# Patient Record
Sex: Male | Born: 1985 | Race: White | Hispanic: No | Marital: Married | Smoking: Current some day smoker
Health system: Southern US, Community
[De-identification: ages and names within clinical notes are randomized; demographics above are authoritative.]

## PROBLEM LIST (undated history)

## (undated) DIAGNOSIS — J45909 Unspecified asthma, uncomplicated: Secondary | ICD-10-CM

## (undated) DIAGNOSIS — F419 Anxiety disorder, unspecified: Secondary | ICD-10-CM

## (undated) DIAGNOSIS — F4310 Post-traumatic stress disorder, unspecified: Secondary | ICD-10-CM

## (undated) DIAGNOSIS — I10 Essential (primary) hypertension: Secondary | ICD-10-CM

## (undated) HISTORY — PX: NASAL SEPTUM SURGERY: SHX37

## (undated) HISTORY — DX: Unspecified asthma, uncomplicated: J45.909

## (undated) HISTORY — PX: HERNIA REPAIR: SHX51

## (undated) HISTORY — PX: APPENDECTOMY: SHX54

---
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
## 2007-07-08 ENCOUNTER — Emergency Department (HOSPITAL_COMMUNITY): Admission: EM | Admit: 2007-07-08 | Discharge: 2007-07-09 | Payer: Self-pay | Admitting: Emergency Medicine

## 2011-05-06 NOTE — Op Note (Signed)
NAMECHANZ, CAHALL                          ACCOUNT NO.:  [id number]   MEDICAL RECORD NO.:  [id number]                   PATIENT TYPE:  INP   LOCATION:  0457                                 FACILITY:  [facility]   PHYSICIAN:  Ollen Gross. Vernell Morgans, M.D.              DATE OF BIRTH:  1986-10-12   DATE OF PROCEDURE:  11/04/2002  DATE OF DISCHARGE:  11/05/2002                                 OPERATIVE REPORT   PREOPERATIVE DIAGNOSIS:  Acute appendicitis.   POSTOPERATIVE DIAGNOSIS:  Acute appendicitis.   PROCEDURE:  Laparoscopic appendectomy.   SURGEON:  Ollen Gross. Carolynne Edouard, M.D.   ANESTHESIA:  General endotracheal.   DESCRIPTION OF PROCEDURE:  After informed consent was obtained, The patient  was brought to the operating room, placed in supine position on the  operating room table. The patient's left arm was tucked at his side. After  adequate induction of general anesthesia, the patient's abdomen was prepped  with Betadine, draped in the usual sterile manner. The area below the  umbilicus was infiltrated with 0.25% Marcaine and a small incision was made  with a 15 blade knife. This incision was carried down through the  subcutaneous tissue bluntly with the Kelly clamp and [employer] retractors  until the linea alba was identified. The linea alba was then incised with  the 15 blade knife and each side was grasped with Kocher clamps and elevated  anteriorly. The preperitoneal space was then probed bluntly with a hemostat  until the peritoneum was opened and access was gained to the abdominal  cavity. A #0 Vicryl pursestring stitch was then placed in the fascia  surrounding this opening, a Hasson cannula was placed through this opening  and anchored in place with the previously placed Vicryl pursestring stitch.  The abdomen was then insufflated with carbon dioxide without difficulty. A  laparoscope was placed through the Hasson cannula. The patient was placed in  slight amount of Trendelenburg  position and rotated with his left side down  a little bit. There was some inflammation that was identifiable in the right  lower quadrant at the edge of the cecum. Next, in the suprapubic area, a  small area was infiltrated with 0.25% Marcaine, an incision was made with a  10 blade knife and a 12 mm port was then placed bluntly through this  incision into the abdominal cavity under direct vision. Next, a site was  chosen just above the Hasson cannula into the right of midline for placement  of a 5 mm port. This area was infiltrated with 0.25% Marcaine and a small  stab incision was made with a 15 blade knife. A 5 mm port was then placed  bluntly through this incision into the abdominal cavity under direct vision.  Using these ports, the right lower quadrant was explored and an inflamed  appendix was able to be identified, it was note adhesed to any structures  in  the pelvis. It was able to be elevated and the mesoappendix was taken down  using the harmonic scalpel. Once this was accomplished, an EndoGIA stapler  with a standard load was placed through the 12 mm port and across the base  of the appendix which was being elevated anteriorly. The stapler was in good  position and was clamped and fired dividing the appendix. The staple line  looked very good and intact. The appendix was then grasped with a Glassman  retractor and an endoscopic bag was placed through the 12 mm port and  opened. The appendix was placed within the bag and sealed. The abdomen was  then irrigated with copious amounts of saline. A grasping device was then  placed through the Hasson cannula and the laparoscope was removed through  the 12 mm port. The grasping device was used to grasp the opening of the bag  and the bag was retrieved through the infraumbilical port with the Hasson  cannula. The rest of the ports were then removed under direct vision. The  fascial defect was closed with the previously Vicryl pursestring  stitch. The  fascial defect of the 12 mm port in the suprapubic area was also closed with  a figure-of-eight #0 Vicryl stitch. The skin incisions were closed with  interrupted 4-0 Monocryl subcuticular  stitches, Benzoin and Steri-Strips were applied. The patient tolerated the  procedure well. At the end of the case, all sponge, needle and instrument  counts were correct. The patient was awakened and taken to the recovery room  in stable condition.                                               Ollen Gross. Vernell Morgans, M.D.    PST/MEDQ  D:  11/06/2002  T:  11/06/2002  Job:  [id number]

## 2011-09-26 ENCOUNTER — Emergency Department (HOSPITAL_COMMUNITY)
Admission: EM | Admit: 2011-09-26 | Discharge: 2011-09-26 | Disposition: A | Discharge status: Home / Self Care | Attending: Emergency Medicine | Admitting: Emergency Medicine

## 2011-09-26 DIAGNOSIS — K089 Disorder of teeth and supporting structures, unspecified: Secondary | ICD-10-CM | POA: Insufficient documentation

## 2011-09-26 DIAGNOSIS — I10 Essential (primary) hypertension: Secondary | ICD-10-CM | POA: Insufficient documentation

## 2011-09-26 DIAGNOSIS — K08109 Complete loss of teeth, unspecified cause, unspecified class: Secondary | ICD-10-CM | POA: Insufficient documentation

## 2011-09-26 DIAGNOSIS — K08409 Partial loss of teeth, unspecified cause, unspecified class: Secondary | ICD-10-CM | POA: Insufficient documentation

## 2013-06-23 ENCOUNTER — Emergency Department (HOSPITAL_BASED_OUTPATIENT_CLINIC_OR_DEPARTMENT_OTHER)
Admission: EM | Admit: 2013-06-23 | Discharge: 2013-06-23 | Disposition: A | Discharge status: Home / Self Care | Payer: Self-pay | Attending: Emergency Medicine | Admitting: Emergency Medicine

## 2013-06-23 ENCOUNTER — Encounter (HOSPITAL_BASED_OUTPATIENT_CLINIC_OR_DEPARTMENT_OTHER): Payer: Self-pay | Admitting: *Deleted

## 2013-06-23 DIAGNOSIS — K4090 Unilateral inguinal hernia, without obstruction or gangrene, not specified as recurrent: Secondary | ICD-10-CM | POA: Insufficient documentation

## 2013-06-23 DIAGNOSIS — I10 Essential (primary) hypertension: Secondary | ICD-10-CM | POA: Insufficient documentation

## 2013-06-23 HISTORY — DX: Essential (primary) hypertension: I10

## 2013-06-23 MED ORDER — HYDROCODONE-ACETAMINOPHEN 5-325 MG PO TABS: 2.0000 | ORAL_TABLET | ORAL | Status: DC | PRN

## 2013-06-23 NOTE — ED Provider Notes (Signed)
   History    CSN: [id number] Arrival date & time 06/23/13  1136  First MD Initiated Contact with Patient 06/23/13 1153     Chief Complaint  Patient presents with  . Abdominal Pain    HPI Patient presents with right groin pain which occurs off and on for several years.  He has known hernia there.  He did do a lot of lifting and moving yesterday.  Pain did not keep him awake last night.  It has not been associated with any nausea vomiting.  No distention.  No fever chills. Past Medical History  Diagnosis Date  . Hypertension    History reviewed. No pertinent past surgical history. No family history on file. History  Substance Use Topics  . Smoking status: Never Smoker   . Smokeless tobacco: Not on file  . Alcohol Use: No    Review of Systems All other systems reviewed and are negative Allergies  Review of patient's allergies indicates no known allergies.  Home Medications   Current Outpatient Rx  Name  Route  Sig  Dispense  Refill  . HYDROcodone-acetaminophen (NORCO/VICODIN) 5-325 MG per tablet   Oral   Take 2 tablets by mouth every 4 (four) hours as needed for pain.   30 tablet   0    BP 141/90  Pulse 104  Temp(Src) 99 F (37.2 C) (Oral)  Resp 18  SpO2 99% Physical Exam  Nursing note and vitals reviewed. Constitutional: He is oriented to person, place, and time. He appears well-developed and well-nourished. No distress.  HENT:  Head: Normocephalic and atraumatic.  Eyes: Pupils are equal, round, and reactive to light.  Neck: Normal range of motion.  Cardiovascular: Normal rate and intact distal pulses.   Pulmonary/Chest: No respiratory distress.  Abdominal: Normal appearance. He exhibits no distension. There is tenderness (Small easily reducible inguinal hernia noted on the right inguinal canal.). There is no rebound and no guarding.  Genitourinary: Testes normal. Cremasteric reflex is present. Right testis shows no swelling. Left testis shows no swelling. No  penile tenderness.  Musculoskeletal: Normal range of motion.  Neurological: He is alert and oriented to person, place, and time. No cranial nerve deficit.  Skin: Skin is warm and dry. No rash noted.  Psychiatric: He has a normal mood and affect. His behavior is normal.    ED Course  Procedures (including critical care time) Labs Reviewed - No data to display No results found. 1. Inguinal hernia     MDM  No physical findings to suggest incarceration.  Plan is to treat for pain and followup with the surgeon.  Patient will return should he develop increasing pain or associated nausea vomiting.  Nelia Shi, MD 06/23/13 [phone]

## 2013-06-23 NOTE — ED Notes (Signed)
Right side lower abd pain. Pt states that he has a hernia, but it wont stop hurting now despite resting.

## 2018-02-04 ENCOUNTER — Emergency Department (HOSPITAL_BASED_OUTPATIENT_CLINIC_OR_DEPARTMENT_OTHER)
Admission: EM | Admit: 2018-02-04 | Discharge: 2018-02-04 | Disposition: A | Discharge status: Home / Self Care | Payer: Non-veteran care | Attending: Emergency Medicine | Admitting: Emergency Medicine

## 2018-02-04 ENCOUNTER — Encounter (HOSPITAL_BASED_OUTPATIENT_CLINIC_OR_DEPARTMENT_OTHER): Payer: Self-pay | Admitting: Emergency Medicine

## 2018-02-04 ENCOUNTER — Other Ambulatory Visit: Payer: Self-pay

## 2018-02-04 DIAGNOSIS — B029 Zoster without complications: Secondary | ICD-10-CM | POA: Insufficient documentation

## 2018-02-04 DIAGNOSIS — Z79899 Other long term (current) drug therapy: Secondary | ICD-10-CM | POA: Insufficient documentation

## 2018-02-04 DIAGNOSIS — I10 Essential (primary) hypertension: Secondary | ICD-10-CM | POA: Insufficient documentation

## 2018-02-04 DIAGNOSIS — R21 Rash and other nonspecific skin eruption: Secondary | ICD-10-CM | POA: Diagnosis present

## 2018-02-04 HISTORY — DX: Post-traumatic stress disorder, unspecified: F43.10

## 2018-02-04 MED ORDER — VALACYCLOVIR HCL 1 G PO TABS: 1000.0000 mg | ORAL_TABLET | Freq: Three times a day (TID) | ORAL | 0 refills | Status: DC

## 2018-02-04 NOTE — ED Triage Notes (Signed)
Patient states that he started to have pain to his hip region on tues. and now has a rash noted with pain

## 2018-02-04 NOTE — ED Notes (Signed)
ED Provider at bedside. 

## 2018-02-04 NOTE — ED Notes (Signed)
Pt given d/c instructions as per chart. Rx x 2. Verbalizes understanding. No questions. 

## 2018-02-04 NOTE — ED Provider Notes (Signed)
[facility] EMERGENCY DEPARTMENT Provider Note   CSN: [id number] Arrival date & time: 02/04/18  1912     History   Chief Complaint Chief Complaint  Patient presents with  . Rash    HPI Martin Miles is a 32 y.o. male.  HPI  32 year old male presents with rash to his right flank.  He states he noticed pain in the area starting 5 days ago.  On 2/14 he noticed a rash.  The pain is sharp and burning.  Seems to wax and wane.  Sometimes worsens with touching but sometimes worsens without obvious cause.  Otherwise he does not have any abdominal pain, vomiting, diarrhea.  No urinary symptoms or fevers.  He is not sure if he had chickenpox as a kid.  He denies any known immunosuppression such as diabetes, HIV, or cancer.  He has not taken anything for the pain.  He rates the pain is about a 6/10.  The pain is more extensive than just the rash but does not go across his umbilicus or mid back.  Past Medical History:  Diagnosis Date  . Hypertension   . PTSD (post-traumatic stress disorder)    trigger  - large crowds    There are no active problems to display for this patient.   History reviewed. No pertinent surgical history.     Home Medications    Prior to Admission medications   Medication Sig Start Date End Date Taking? Authorizing Provider  lamoTRIgine (LAMICTAL) 200 MG tablet Take 100 mg by mouth daily.   Yes [provider]  sertraline (ZOLOFT) 25 MG tablet Take 50 mg by mouth daily.   Yes [provider]  HYDROcodone-acetaminophen (NORCO/VICODIN) 5-325 MG per tablet Take 2 tablets by mouth every 4 (four) hours as needed for pain. 06/23/13   Nelva Nay, MD  valACYclovir (VALTREX) 1000 MG tablet Take 1 tablet (1,000 mg total) by mouth 3 (three) times daily. 02/04/18   Pricilla Loveless, MD    Family History History reviewed. No pertinent family history.  Social History Social History   Tobacco Use  . Smoking status: Never Smoker  .  Smokeless tobacco: Never Used  Substance Use Topics  . Alcohol use: No  . Drug use: No     Allergies   Patient has no known allergies.   Review of Systems Review of Systems  Constitutional: Negative for fever.  Gastrointestinal: Negative for abdominal pain, diarrhea and vomiting.  Genitourinary: Positive for flank pain.  Skin: Positive for rash.  All other systems reviewed and are negative.    Physical Exam Updated Vital Signs BP (!) 150/97 (BP Location: Left Arm)   Pulse 76   Temp 98.2 F (36.8 C) (Oral)   Resp 18   Ht 6\' 1"  (1.854 m)   Wt 112.5 kg (248 lb)   SpO2 99%   BMI 32.72 kg/m   Physical Exam  Constitutional: He is oriented to person, place, and time. He appears well-developed and well-nourished.  HENT:  Head: Normocephalic and atraumatic.  Right Ear: External ear normal.  Left Ear: External ear normal.  Nose: Nose normal.  Eyes: Right eye exhibits no discharge. Left eye exhibits no discharge.  Neck: Neck supple.  Pulmonary/Chest: Effort normal.  Abdominal: Soft. He exhibits no distension. There is no tenderness.  Neurological: He is alert and oriented to person, place, and time.  Skin: Skin is warm and dry. Rash noted. Rash is vesicular.  Along right flank in the mid axillary line there  is a cluster of vesicles with erythematous base consistent with herpes zoster.  Mild tenderness at the site.  Nursing note and vitals reviewed.    ED Treatments / Results  Labs (all labs ordered are listed, but only abnormal results are displayed) Labs Reviewed - No data to display  EKG  EKG Interpretation None       Radiology No results found.  Procedures Procedures (including critical care time)  Medications Ordered in ED Medications - No data to display   Initial Impression / Assessment and Plan / ED Course  I have reviewed the triage vital signs and the nursing notes.  Pertinent labs & imaging results that were available during my care of the  patient were reviewed by me and considered in my medical decision making (see chart for details).     Presentation is consistent with zoster.  While this is on the 32 year old, he denies any known history of any.  He is just around 72 hours since onset of rash.  We discussed that there is less efficacy the longer we go without treatment and thus we will try antivirals.  I have encouraged him to use ibuprofen and Tylenol given the discomfort he has with it.  However I do not see any other concerning findings on his history or exam.  He appears stable for discharge home to follow-up with his PCP at the [facility].  Return precautions.  Final Clinical Impressions(s) / ED Diagnoses   Final diagnoses:  Herpes zoster without complication    ED Discharge Orders        Ordered    valACYclovir (VALTREX) 1000 MG tablet  3 times daily     02/04/18 2310       Pricilla LovelessGoldston, Naziah Weckerly, MD 02/04/18 2339

## 2018-03-15 ENCOUNTER — Emergency Department (HOSPITAL_BASED_OUTPATIENT_CLINIC_OR_DEPARTMENT_OTHER): Payer: Non-veteran care

## 2018-03-15 ENCOUNTER — Emergency Department (HOSPITAL_BASED_OUTPATIENT_CLINIC_OR_DEPARTMENT_OTHER)
Admission: EM | Admit: 2018-03-15 | Discharge: 2018-03-16 | Disposition: A | Discharge status: Home / Self Care | Payer: Non-veteran care | Attending: Emergency Medicine | Admitting: Emergency Medicine

## 2018-03-15 ENCOUNTER — Other Ambulatory Visit: Payer: Self-pay

## 2018-03-15 ENCOUNTER — Encounter (HOSPITAL_BASED_OUTPATIENT_CLINIC_OR_DEPARTMENT_OTHER): Payer: Self-pay

## 2018-03-15 DIAGNOSIS — Z79899 Other long term (current) drug therapy: Secondary | ICD-10-CM | POA: Insufficient documentation

## 2018-03-15 DIAGNOSIS — I10 Essential (primary) hypertension: Secondary | ICD-10-CM | POA: Insufficient documentation

## 2018-03-15 DIAGNOSIS — R072 Precordial pain: Secondary | ICD-10-CM | POA: Diagnosis not present

## 2018-03-15 HISTORY — DX: Anxiety disorder, unspecified: F41.9

## 2018-03-15 MED ORDER — GI COCKTAIL ~~LOC~~
30.0000 mL | Freq: Once | ORAL | Status: AC
  Administered 2018-03-15: 30 mL via ORAL
  Filled 2018-03-15: qty 30

## 2018-03-15 NOTE — ED Triage Notes (Signed)
C/o CP x 1 month-worse x 1-2 weeks-NAD-steady gait

## 2018-03-16 LAB — CBC
HCT: 42.3 % (ref 39.0–52.0)
HEMOGLOBIN: 14.7 g/dL (ref 13.0–17.0)
MCH: 29.7 pg (ref 26.0–34.0)
MCHC: 34.8 g/dL (ref 30.0–36.0)
MCV: 85.5 fL (ref 78.0–100.0)
Platelets: 264 10*3/uL (ref 150–400)
RBC: 4.95 MIL/uL (ref 4.22–5.81)
RDW: 12.6 % (ref 11.5–15.5)
WBC: 7.9 10*3/uL (ref 4.0–10.5)

## 2018-03-16 LAB — BASIC METABOLIC PANEL
Anion gap: 8 (ref 5–15)
BUN: 17 mg/dL (ref 6–20)
CALCIUM: 9 mg/dL (ref 8.9–10.3)
CHLORIDE: 105 mmol/L (ref 101–111)
CO2: 23 mmol/L (ref 22–32)
CREATININE: 1.01 mg/dL (ref 0.61–1.24)
GFR calc Af Amer: >60 mL/min (ref >60)
Glucose, Bld: 97 mg/dL (ref 65–99)
Potassium: 3.6 mmol/L (ref 3.5–5.1)
SODIUM: 136 mmol/L (ref 135–145)

## 2018-03-16 LAB — TROPONIN I: Troponin I: <0.03 ng/mL (ref <0.03)

## 2018-03-16 LAB — D-DIMER, QUANTITATIVE (NOT AT ARMC): D DIMER QUANT: 0.29 ug{FEU}/mL (ref 0.00–0.50)

## 2018-03-16 MED ORDER — OMEPRAZOLE 20 MG PO CPDR: 20.0000 mg | DELAYED_RELEASE_CAPSULE | Freq: Every day | ORAL | 0 refills | Status: DC

## 2018-03-16 NOTE — ED Provider Notes (Signed)
[facility] EMERGENCY DEPARTMENT Provider Note   CSN: [id number] Arrival date & time: 03/15/18  2154     History   Chief Complaint Chief Complaint  Patient presents with  . Chest Pain    HPI Martin Miles is a 32 y.o. male.  The history is provided by the patient.  Chest Pain   This is a chronic problem. The current episode started more than 1 week ago (more than a month ago). The problem occurs constantly. The problem has been gradually worsening. The pain is associated with rest (stress). The pain is present in the substernal region. The pain is moderate. The quality of the pain is described as dull. The pain does not radiate. Pertinent negatives include no abdominal pain, no back pain, no claudication, no cough, no diaphoresis, no dizziness, no exertional chest pressure, no fever, no headaches, no hemoptysis, no irregular heartbeat, no leg pain, no lower extremity edema, no malaise/fatigue, no nausea, no near-syncope, no numbness, no orthopnea, no palpitations, no PND, no shortness of breath, no sputum production, no syncope, no vomiting and no weakness. He has tried nothing for the symptoms. The treatment provided no relief. Risk factors include male gender.  Pertinent negatives for family medical history include: no Marfan's syndrome.  Procedure history is negative for cardiac catheterization.    Past Medical History:  Diagnosis Date  . Anxiety   . Hypertension   . PTSD (post-traumatic stress disorder)    trigger  - large crowds    There are no active problems to display for this patient.   History reviewed. No pertinent surgical history.      Home Medications    Prior to Admission medications   Medication Sig Start Date End Date Taking? Authorizing Provider  HYDROcodone-acetaminophen (NORCO/VICODIN) 5-325 MG per tablet Take 2 tablets by mouth every 4 (four) hours as needed for pain. 06/23/13   Nelva Nay, MD  lamoTRIgine (LAMICTAL) 200 MG tablet Take  100 mg by mouth daily.    [provider]  sertraline (ZOLOFT) 25 MG tablet Take 50 mg by mouth daily.    [provider]  valACYclovir (VALTREX) 1000 MG tablet Take 1 tablet (1,000 mg total) by mouth 3 (three) times daily. 02/04/18   Pricilla Loveless, MD    Family History No family history on file.  Social History Social History   Tobacco Use  . Smoking status: Never Smoker  . Smokeless tobacco: Never Used  Substance Use Topics  . Alcohol use: Yes    Comment: occ  . Drug use: No     Allergies   Remeron [mirtazapine]   Review of Systems Review of Systems  Constitutional: Negative for diaphoresis, fever and malaise/fatigue.  Respiratory: Negative for cough, hemoptysis, sputum production and shortness of breath.   Cardiovascular: Positive for chest pain. Negative for palpitations, orthopnea, claudication, leg swelling, syncope, PND and near-syncope.  Gastrointestinal: Negative for abdominal pain, nausea and vomiting.  Musculoskeletal: Negative for back pain.  Neurological: Negative for dizziness, weakness, numbness and headaches.  All other systems reviewed and are negative.    Physical Exam Updated Vital Signs BP (!) 143/100 (BP Location: Left Arm)   Pulse 81   Temp 98.8 F (37.1 C) (Oral)   Resp 18   Ht 6\' 1"  (1.854 m)   Wt 115.6 kg (254 lb 13.6 oz)   SpO2 100%   BMI 33.62 kg/m   Physical Exam  Constitutional: He is oriented to person, place, and time. He appears well-developed and  well-nourished. No distress.  HENT:  Head: Normocephalic and atraumatic.  Mouth/Throat: No oropharyngeal exudate.  Eyes: Pupils are equal, round, and reactive to light. Conjunctivae are normal.  Neck: Normal range of motion. Neck supple.  Cardiovascular: Normal rate, regular rhythm, normal heart sounds and intact distal pulses.  Pulmonary/Chest: Effort normal and breath sounds normal. No stridor. He has no wheezes. He has no rales.  Abdominal: Soft. He exhibits no  mass. There is no tenderness. There is no rebound and no guarding.  Gassy throughout  Musculoskeletal: Normal range of motion.  Neurological: He is alert and oriented to person, place, and time.  Skin: Skin is warm and dry. Capillary refill takes less than 2 seconds. He is not diaphoretic.  Psychiatric: He has a normal mood and affect.     ED Treatments / Results  Labs (all labs ordered are listed, but only abnormal results are displayed)  Results for orders placed or performed during the hospital encounter of 03/15/18  Basic metabolic panel  Result Value Ref Range   Sodium 136 135 - 145 mmol/L   Potassium 3.6 3.5 - 5.1 mmol/L   Chloride 105 101 - 111 mmol/L   CO2 23 22 - 32 mmol/L   Glucose, Bld 97 65 - 99 mg/dL   BUN 17 6 - 20 mg/dL   Creatinine, Ser 4.091.01 0.61 - 1.24 mg/dL   Calcium 9.0 8.9 - 81.110.3 mg/dL   GFR calc non Af Amer >60 >60 mL/min   GFR calc Af Amer >60 >60 mL/min   Anion gap 8 5 - 15  CBC  Result Value Ref Range   WBC 7.9 4.0 - 10.5 K/uL   RBC 4.95 4.22 - 5.81 MIL/uL   Hemoglobin 14.7 13.0 - 17.0 g/dL   HCT 91.442.3 78.239.0 - 95.652.0 %   MCV 85.5 78.0 - 100.0 fL   MCH 29.7 26.0 - 34.0 pg   MCHC 34.8 30.0 - 36.0 g/dL   RDW 21.312.6 08.611.5 - 57.815.5 %   Platelets 264 150 - 400 K/uL  Troponin I  Result Value Ref Range   Troponin I <0.03 <0.03 ng/mL  D-dimer, quantitative (not [facility])  Result Value Ref Range   D-Dimer, Quant 0.29 0.00 - 0.50 ug/mL-FEU   Dg Chest 2 View  Result Date: 03/15/2018 CLINICAL DATA:  Chest pain EXAM: CHEST - 2 VIEW COMPARISON:  None. FINDINGS: The heart size and mediastinal contours are within normal limits. Both lungs are clear. The visualized skeletal structures are unremarkable. IMPRESSION: No active cardiopulmonary disease. Electronically Signed   By: Deatra RobinsonKevin  Herman M.D.   On: 03/15/2018 22:26    EKG EKG Interpretation  Date/Time:  Thursday March 15 2018 22:03:47 EDT Ventricular Rate:  86 PR Interval:  154 QRS Duration: 92 QT  Interval:  348 QTC Calculation: 416 R Axis:   39 Text Interpretation:  Normal sinus rhythm Incomplete right bundle branch block Borderline ECG No STEMI.  Confirmed by Alona BeneLong, Joshua [phone]([id number]) on 03/15/2018 10:07:02 PM   Radiology Dg Chest 2 View  Result Date: 03/15/2018 CLINICAL DATA:  Chest pain EXAM: CHEST - 2 VIEW COMPARISON:  None. FINDINGS: The heart size and mediastinal contours are within normal limits. Both lungs are clear. The visualized skeletal structures are unremarkable. IMPRESSION: No active cardiopulmonary disease. Electronically Signed   By: Deatra RobinsonKevin  Herman M.D.   On: 03/15/2018 22:26    Procedures Procedures (including critical care time)  Medications Ordered in ED Medications  gi cocktail (Maalox,Lidocaine,Donnatal) (30 mLs Oral Given 03/15/18 2346)  Ruled out for PE and MI in the ED.  HEART score is one very low risk for mace.   Final Clinical Impressions(s) / ED Diagnoses  Suspect anxiety superimposed upon GERD.  Will start PPI talk about long term medication for your anxiety with your PMD.    Return for weakness, numbness, changes in vision or speech, fevers >100.4 unrelieved by medication, shortness of breath, intractable vomiting, or diarrhea, abdominal pain, Inability to tolerate liquids or food, cough, altered mental status or any concerns. No signs of systemic illness or infection. The patient is nontoxic-appearing on exam and vital signs are within normal limits.   I have reviewed the triage vital signs and the nursing notes. Pertinent labs &imaging results that were available during my care of the patient were reviewed by me and considered in my medical decision making (see chart for details).  After history, exam, and medical workup I feel the patient has been appropriately medically screened and is safe for discharge home. Pertinent diagnoses were discussed with the patient. Patient was given return precautions.     Rachel Rison, MD 03/16/18  0400

## 2018-03-16 NOTE — ED Notes (Signed)
Pt reports that he is pain free after GI cocktail.  No distress at this time, EDP notified

## 2019-06-04 IMAGING — CR DG CHEST 2V
2 series · 2 of 2 positions shown · non-contrast
Comparison: None.

CLINICAL DATA: Chest pain

EXAM:
CHEST - 2 VIEW

[w chest pa]
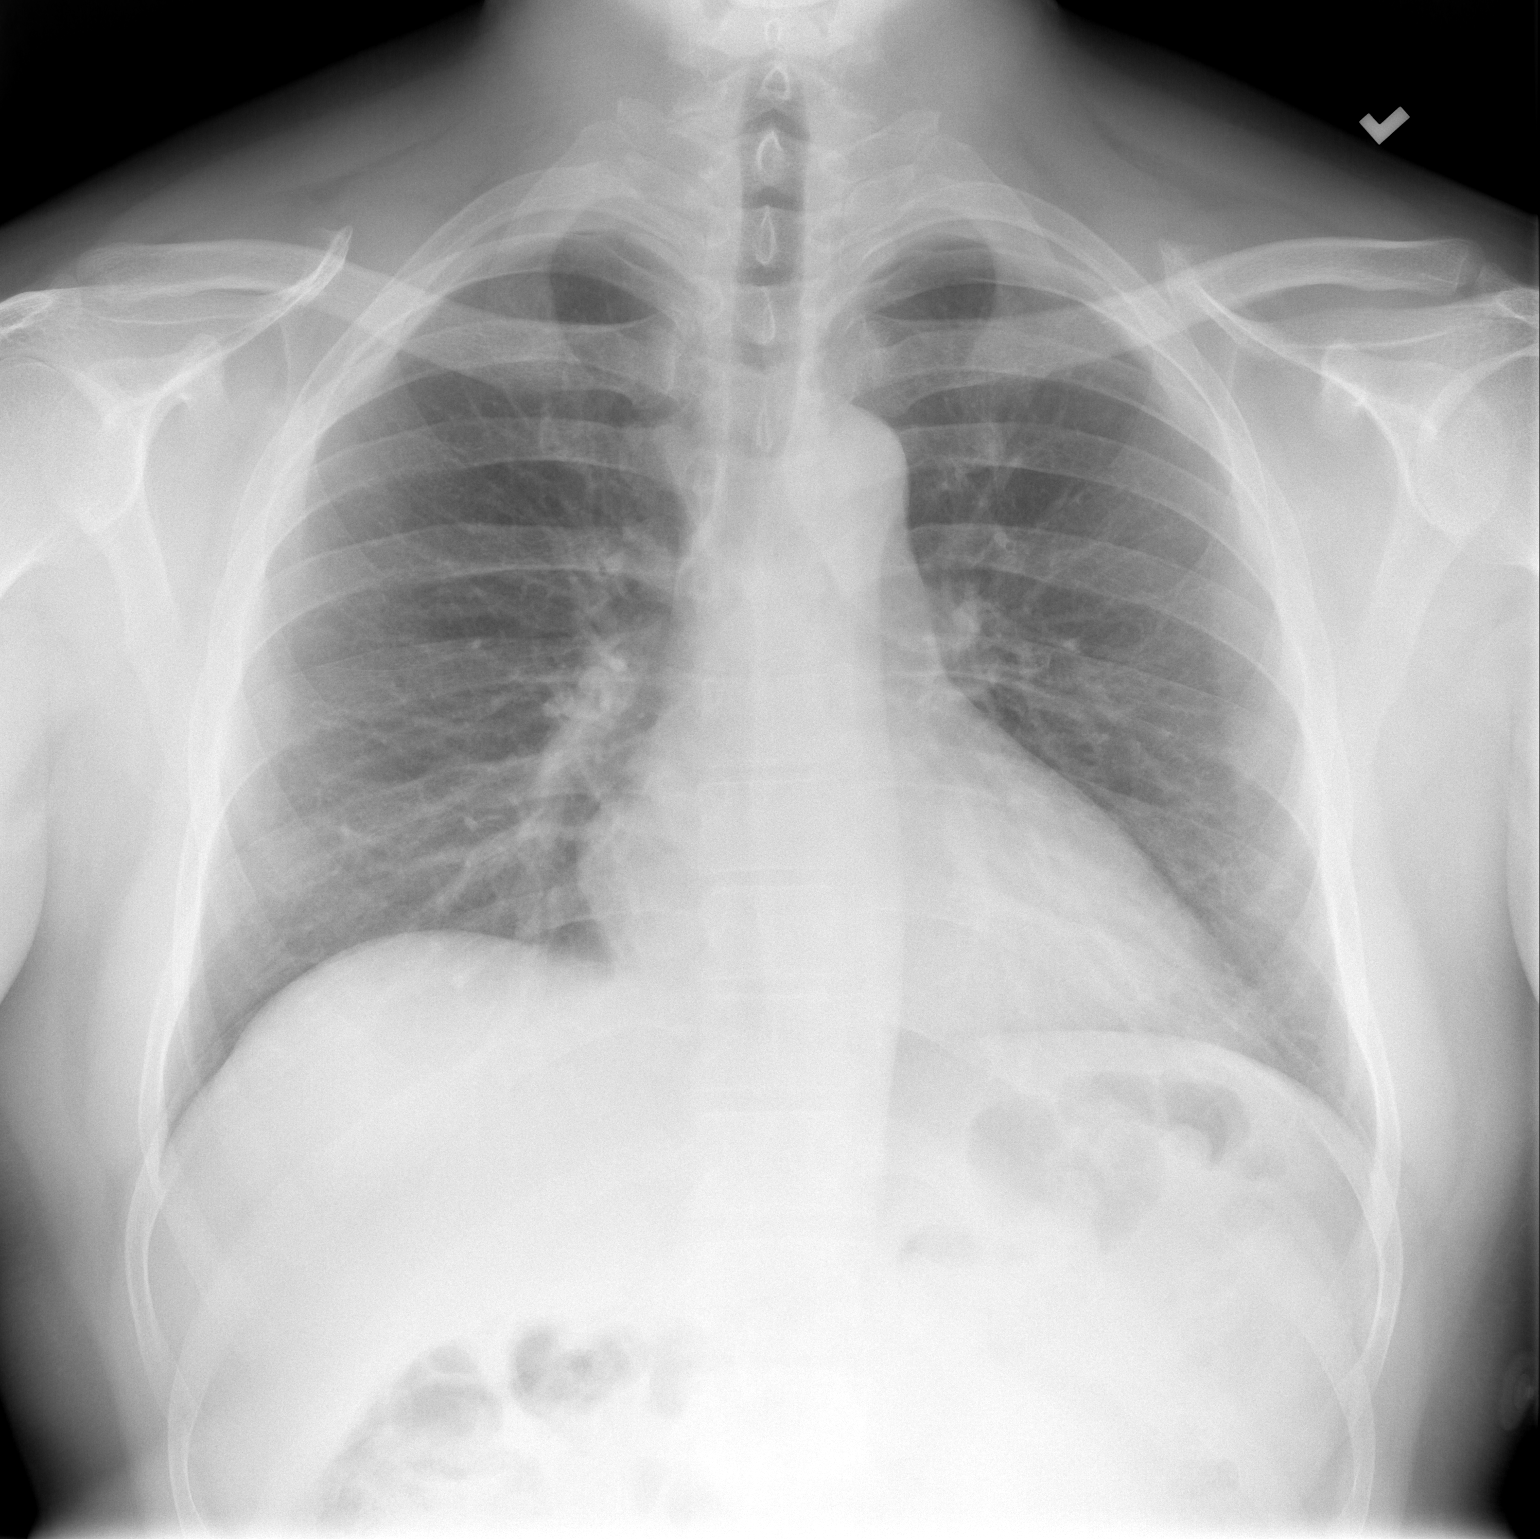

[w chest lat]
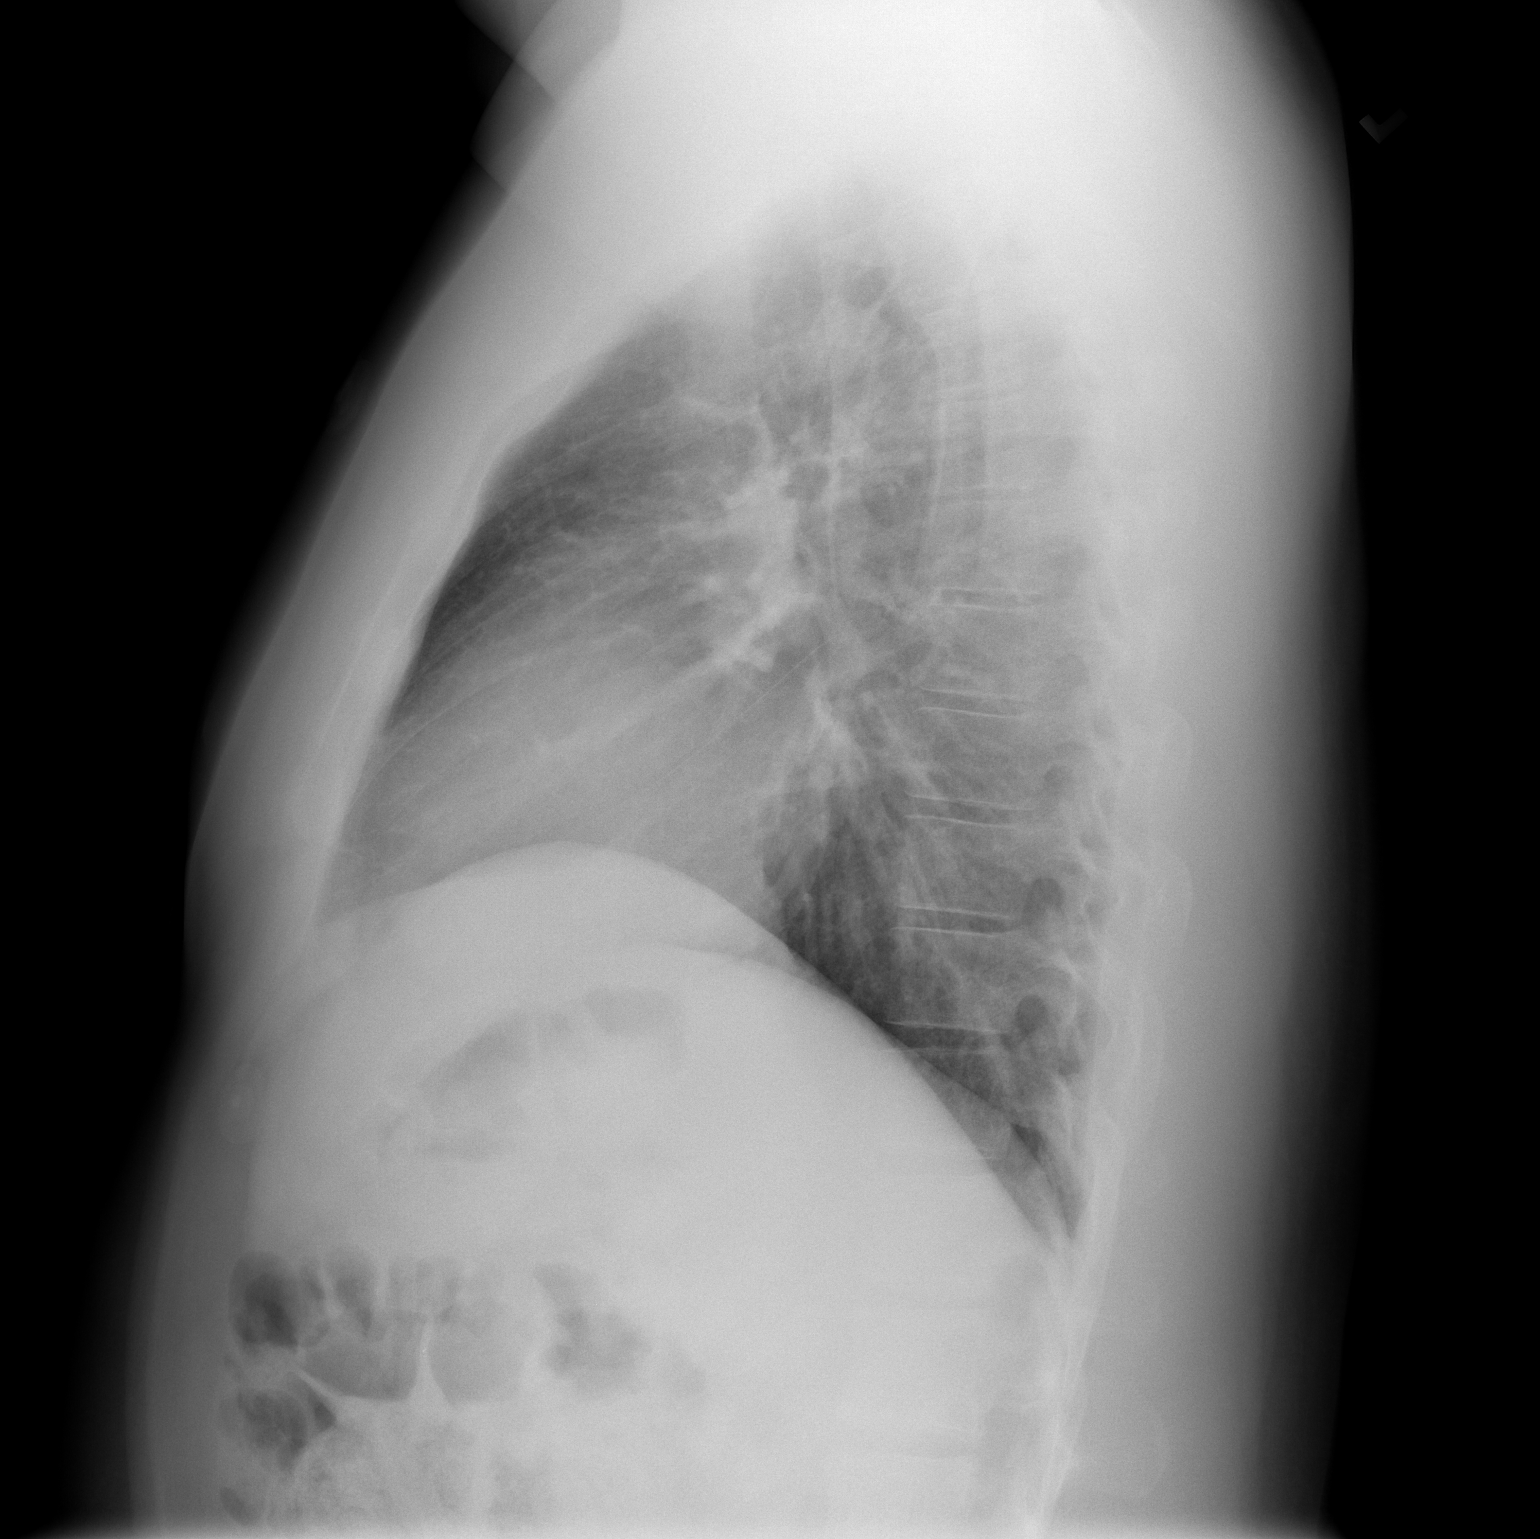

[2 of 2 positions shown; findings below may reference images not displayed]

FINDINGS: The heart size and mediastinal contours are within normal limits.
Both lungs are clear. The visualized skeletal structures are
unremarkable.
IMPRESSION: No active cardiopulmonary disease.

## 2021-05-19 ENCOUNTER — Other Ambulatory Visit: Payer: Self-pay

## 2021-05-19 ENCOUNTER — Encounter (HOSPITAL_BASED_OUTPATIENT_CLINIC_OR_DEPARTMENT_OTHER): Payer: Self-pay | Admitting: Urology

## 2021-05-19 ENCOUNTER — Emergency Department (HOSPITAL_BASED_OUTPATIENT_CLINIC_OR_DEPARTMENT_OTHER)
Admission: EM | Admit: 2021-05-19 | Discharge: 2021-05-19 | Disposition: A | Discharge status: Home / Self Care | Payer: No Typology Code available for payment source | Attending: Emergency Medicine | Admitting: Emergency Medicine

## 2021-05-19 DIAGNOSIS — R112 Nausea with vomiting, unspecified: Secondary | ICD-10-CM | POA: Diagnosis not present

## 2021-05-19 DIAGNOSIS — R109 Unspecified abdominal pain: Secondary | ICD-10-CM | POA: Diagnosis not present

## 2021-05-19 DIAGNOSIS — Z20822 Contact with and (suspected) exposure to covid-19: Secondary | ICD-10-CM | POA: Diagnosis not present

## 2021-05-19 DIAGNOSIS — R197 Diarrhea, unspecified: Secondary | ICD-10-CM | POA: Insufficient documentation

## 2021-05-19 DIAGNOSIS — E860 Dehydration: Secondary | ICD-10-CM | POA: Insufficient documentation

## 2021-05-19 DIAGNOSIS — I10 Essential (primary) hypertension: Secondary | ICD-10-CM | POA: Diagnosis not present

## 2021-05-19 LAB — LIPASE, BLOOD: Lipase: 30 U/L (ref 11–51)

## 2021-05-19 LAB — URINALYSIS, ROUTINE W REFLEX MICROSCOPIC
Bilirubin Urine: NEGATIVE
Glucose, UA: NEGATIVE mg/dL
Hgb urine dipstick: NEGATIVE
Ketones, ur: NEGATIVE mg/dL
Leukocytes,Ua: NEGATIVE
Nitrite: NEGATIVE
Protein, ur: NEGATIVE mg/dL
Specific Gravity, Urine: 1.02 (ref 1.005–1.030)
pH: 5.5 (ref 5.0–8.0)

## 2021-05-19 LAB — CBC WITH DIFFERENTIAL/PLATELET
Abs Immature Granulocytes: 0.04 10*3/uL (ref 0.00–0.07)
Basophils Absolute: 0.1 10*3/uL (ref 0.0–0.1)
Basophils Relative: 1 %
Eosinophils Absolute: 0.3 10*3/uL (ref 0.0–0.5)
Eosinophils Relative: 3 %
HCT: 42.9 % (ref 39.0–52.0)
Hemoglobin: 14.9 g/dL (ref 13.0–17.0)
Immature Granulocytes: 0 %
Lymphocytes Relative: 14 %
Lymphs Abs: 1.5 10*3/uL (ref 0.7–4.0)
MCH: 30.1 pg (ref 26.0–34.0)
MCHC: 34.7 g/dL (ref 30.0–36.0)
MCV: 86.7 fL (ref 80.0–100.0)
Monocytes Absolute: 1.1 10*3/uL — ABNORMAL HIGH (ref 0.1–1.0)
Monocytes Relative: 10 %
Neutro Abs: 7.6 10*3/uL (ref 1.7–7.7)
Neutrophils Relative %: 72 %
Platelets: 255 10*3/uL (ref 150–400)
RBC: 4.95 MIL/uL (ref 4.22–5.81)
RDW: 12.1 % (ref 11.5–15.5)
WBC: 10.5 10*3/uL (ref 4.0–10.5)
nRBC: 0 % (ref 0.0–0.2)

## 2021-05-19 LAB — RESP PANEL BY RT-PCR (FLU A&B, COVID) ARPGX2
Influenza A by PCR: NEGATIVE
Influenza B by PCR: NEGATIVE
SARS Coronavirus 2 by RT PCR: NEGATIVE

## 2021-05-19 LAB — COMPREHENSIVE METABOLIC PANEL
ALT: 17 U/L (ref 0–44)
AST: 17 U/L (ref 15–41)
Albumin: 4.2 g/dL (ref 3.5–5.0)
Alkaline Phosphatase: 63 U/L (ref 38–126)
Anion gap: 9 (ref 5–15)
BUN: 17 mg/dL (ref 6–20)
CO2: 21 mmol/L — ABNORMAL LOW (ref 22–32)
Calcium: 9 mg/dL (ref 8.9–10.3)
Chloride: 105 mmol/L (ref 98–111)
Creatinine, Ser: 1.11 mg/dL (ref 0.61–1.24)
GFR, Estimated: >60 mL/min (ref >60)
Glucose, Bld: 101 mg/dL — ABNORMAL HIGH (ref 70–99)
Potassium: 3.6 mmol/L (ref 3.5–5.1)
Sodium: 135 mmol/L (ref 135–145)
Total Bilirubin: 0.6 mg/dL (ref 0.3–1.2)
Total Protein: 7.4 g/dL (ref 6.5–8.1)

## 2021-05-19 MED ORDER — ONDANSETRON HCL 4 MG/2ML IJ SOLN
4.0000 mg | Freq: Once | INTRAMUSCULAR | Status: AC
  Administered 2021-05-19: 4 mg via INTRAVENOUS
  Filled 2021-05-19: qty 2

## 2021-05-19 MED ORDER — ONDANSETRON 4 MG PO TBDP: 4.0000 mg | ORAL_TABLET | Freq: Three times a day (TID) | ORAL | 0 refills | Status: AC | PRN

## 2021-05-19 MED ORDER — SODIUM CHLORIDE 0.9 % IV BOLUS
1000.0000 mL | Freq: Once | INTRAVENOUS | Status: AC
  Administered 2021-05-19: 1000 mL via INTRAVENOUS

## 2021-05-19 NOTE — ED Notes (Signed)
Pt unable to provide a urine sample at this time, will call us when he is able to provide one.

## 2021-05-19 NOTE — ED Provider Notes (Signed)
[facility] EMERGENCY DEPARTMENT Provider Note   CSN: [id number] Arrival date & time: 05/19/21  1451     History Chief Complaint  Patient presents with  . Emesis  . Diarrhea    Martin Miles is a 35 y.o. male.  Pt presents to the ED today with n/v/d and abdominal cramping.  Pt said he ate old lunch meat yesterday around 1500 and developed his sx shortly afterwards.  He said he is no longer vomiting, but still feels nauseous.  No f/c.          Past Medical History:  Diagnosis Date  . Anxiety   . Hypertension   . PTSD (post-traumatic stress disorder)    trigger  - large crowds    There are no problems to display for this patient.   History reviewed. No pertinent surgical history.     History reviewed. No pertinent family history.  Social History   Tobacco Use  . Smoking status: Never Smoker  . Smokeless tobacco: Never Used  Substance Use Topics  . Alcohol use: Yes    Comment: occ  . Drug use: No    Home Medications Prior to Admission medications   Medication Sig Start Date End Date Taking? Authorizing Provider  ondansetron (ZOFRAN ODT) 4 MG disintegrating tablet Take 1 tablet (4 mg total) by mouth every 8 (eight) hours as needed for nausea or vomiting. 05/19/21  Yes Jacalyn Lefevre, MD  HYDROcodone-acetaminophen (NORCO/VICODIN) 5-325 MG per tablet Take 2 tablets by mouth every 4 (four) hours as needed for pain. 06/23/13   Nelva Nay, MD  lamoTRIgine (LAMICTAL) 200 MG tablet Take 100 mg by mouth daily.    [provider]  omeprazole (PRILOSEC) 20 MG capsule Take 1 capsule (20 mg total) by mouth daily. 03/16/18   Palumbo, April, MD  sertraline (ZOLOFT) 25 MG tablet Take 50 mg by mouth daily.    [provider]  valACYclovir (VALTREX) 1000 MG tablet Take 1 tablet (1,000 mg total) by mouth 3 (three) times daily. 02/04/18   Pricilla Loveless, MD    Allergies    Remeron [mirtazapine]  Review of Systems   Review of Systems   Gastrointestinal: Positive for abdominal pain, diarrhea, nausea and vomiting.  All other systems reviewed and are negative.   Physical Exam Updated Vital Signs BP 111/62 (BP Location: Right Arm)   Pulse 68   Temp 98.9 F (37.2 C) (Oral)   Resp 18   Ht 6\' 1"  (1.854 m)   Wt 122.5 kg   SpO2 99%   BMI 35.62 kg/m   Physical Exam Vitals and nursing note reviewed.  Constitutional:      Appearance: Normal appearance.  HENT:     Head: Normocephalic and atraumatic.     Right Ear: External ear normal.     Left Ear: External ear normal.     Nose: Nose normal.     Mouth/Throat:     Mouth: Mucous membranes are dry.  Eyes:     Extraocular Movements: Extraocular movements intact.     Conjunctiva/sclera: Conjunctivae normal.     Pupils: Pupils are equal, round, and reactive to light.  Cardiovascular:     Rate and Rhythm: Normal rate and regular rhythm.     Pulses: Normal pulses.     Heart sounds: Normal heart sounds.  Pulmonary:     Effort: Pulmonary effort is normal.     Breath sounds: Normal breath sounds.  Abdominal:     General: Abdomen is flat.  Bowel sounds are normal.     Palpations: Abdomen is soft.  Musculoskeletal:        General: Normal range of motion.     Cervical back: Normal range of motion and neck supple.  Skin:    General: Skin is warm.     Capillary Refill: Capillary refill takes less than 2 seconds.  Neurological:     General: No focal deficit present.     Mental Status: He is alert and oriented to person, place, and time.  Psychiatric:        Mood and Affect: Mood normal.        Behavior: Behavior normal.        Thought Content: Thought content normal.        Judgment: Judgment normal.     ED Results / Procedures / Treatments   Labs (all labs ordered are listed, but only abnormal results are displayed) Labs Reviewed  CBC WITH DIFFERENTIAL/PLATELET - Abnormal; Notable for the following components:      Result Value   Monocytes Absolute 1.1 (*)     All other components within normal limits  COMPREHENSIVE METABOLIC PANEL - Abnormal; Notable for the following components:   CO2 21 (*)    Glucose, Bld 101 (*)    All other components within normal limits  RESP PANEL BY RT-PCR (FLU A&B, COVID) ARPGX2  LIPASE, BLOOD  URINALYSIS, ROUTINE W REFLEX MICROSCOPIC    EKG None  Radiology No results found.  Procedures Procedures   Medications Ordered in ED Medications  sodium chloride 0.9 % bolus 1,000 mL (0 mLs Intravenous Stopped 05/19/21 1709)  ondansetron (ZOFRAN) injection 4 mg (4 mg Intravenous Given 05/19/21 1601)    ED Course  I have reviewed the triage vital signs and the nursing notes.  Pertinent labs & imaging results that were available during my care of the patient were reviewed by me and considered in my medical decision making (see chart for details).    MDM Rules/Calculators/A&P                          Labs are unremarkable.  He is able to tolerate po fluids.  He is to return if worse.  F/u with pcp. Final Clinical Impression(s) / ED Diagnoses Final diagnoses:  Dehydration  Nausea vomiting and diarrhea    Rx / DC Orders ED Discharge Orders         Ordered    ondansetron (ZOFRAN ODT) 4 MG disintegrating tablet  Every 8 hours PRN        05/19/21 1724           Jacalyn Lefevre, MD 05/19/21 1725

## 2021-05-19 NOTE — ED Notes (Signed)
ED Provider at bedside. 

## 2021-05-19 NOTE — ED Triage Notes (Signed)
Pt states after eating old lunch meat started having N/V/D at 1500 yesterday. States watery stool, denies fever

## 2021-10-18 ENCOUNTER — Other Ambulatory Visit: Payer: Self-pay

## 2021-10-18 ENCOUNTER — Ambulatory Visit (INDEPENDENT_AMBULATORY_CARE_PROVIDER_SITE_OTHER): Payer: No Typology Code available for payment source | Admitting: Internal Medicine

## 2021-10-18 ENCOUNTER — Encounter: Payer: Self-pay | Admitting: Internal Medicine

## 2021-10-18 VITALS — BP 134/96 | HR 71 | Temp 98.0°F | Resp 16 | Ht 74.0 in | Wt 281.4 lb

## 2021-10-18 DIAGNOSIS — J3089 Other allergic rhinitis: Secondary | ICD-10-CM | POA: Diagnosis not present

## 2021-10-18 DIAGNOSIS — J4520 Mild intermittent asthma, uncomplicated: Secondary | ICD-10-CM | POA: Diagnosis not present

## 2021-10-18 DIAGNOSIS — J302 Other seasonal allergic rhinitis: Secondary | ICD-10-CM | POA: Insufficient documentation

## 2021-10-18 MED ORDER — ALBUTEROL SULFATE HFA 108 (90 BASE) MCG/ACT IN AERS: 2.0000 | INHALATION_SPRAY | Freq: Four times a day (QID) | RESPIRATORY_TRACT | 2 refills | Status: DC | PRN

## 2021-10-18 MED ORDER — EPINEPHRINE 0.3 MG/0.3ML IJ SOAJ: 0.3000 mg | Freq: Once | INTRAMUSCULAR | 2 refills | Status: AC

## 2021-10-18 MED ORDER — OLOPATADINE HCL 0.2 % OP SOLN: 1.0000 [drp] | OPHTHALMIC | 5 refills | Status: DC

## 2021-10-18 MED ORDER — CETIRIZINE HCL 10 MG PO TABS: 10.0000 mg | ORAL_TABLET | Freq: Every day | ORAL | 5 refills | Status: DC

## 2021-10-18 NOTE — Patient Instructions (Addendum)
Chronic Rhinitis- seasonal and perennial allergic rhinitis: - allergy testing today was positive to grasses, trees, dust mites and cat - allergen avoidance as below - consider allergy shots as long term control of your symptoms by teaching your immune system to be more tolerant of your allergy triggers - Continue over the counter antihistamine daily or daily as needed.  Your options include Zyrtec (Cetirizine) 10mg , Claritin (Loratadine) 10mg , Allegra (Fexofenadine) 180mg , or Xyzal (Levocetirinze) 5mg .  Prescription sent for cetirizine. - Consider nasal saline rinses to help remove pollens, mucus and hydrate nasal mucosa  Unilateral Alternating Eyelid Swelling: - unclear etiology, possibly related to cat or dust mites - if recurs, can take non-sedating antihistamine- Your options include: Zyrtec (cetirizine) 10 mg, Claritin (loratadine) 10 mg, Xyzal (levocetirizine) 5 mg or Allegra (fexofenadine) 180 mg daily as needed - Can also try pataday 1 drop in each eye as needed   Intermittent Asthma: - your lung testing shows mild obstruction with significant response to bronchodilator, consistent with asthma - Use Albuterol (Proair/Ventolin) 2 puffs every 4-6 hours as needed for chest tightness, wheezing, or coughing - Use Albuterol (Proair/Ventolin) 2 puffs 15 minutes prior to exercise if you have symptoms with activity - Use a spacer with all inhalers - please keep track of how often you are needing rescue inhaler Albuterol (Proair/Ventolin) as this will help guide future management - Asthma is not controlled if:  - Symptoms are occurring >2 times a week OR  - >2 times a month nighttime awakenings  - Please call the clinic to schedule a follow up if these symptoms arise  Follow-up in 4 to 6 months. ------------------------------------------------------ Control of Dog or Cat Allergen  Avoidance is the best way to manage a dog or cat allergy. If you have a dog or cat and are allergic to dog or  cats, consider removing the dog or cat from the home. If you have a dog or cat but don't want to find it a new home, or if your family wants a pet even though someone in the household is allergic, here are some strategies that may help keep symptoms at bay:  Keep the pet out of your bedroom and restrict it to only a few rooms. Be advised that keeping the dog or cat in only one room will not limit the allergens to that room. Don't pet, hug or kiss the dog or cat; if you do, wash your hands with soap and water. High-efficiency particulate air (HEPA) cleaners run continuously in a bedroom or living room can reduce allergen levels over time. Regular use of a high-efficiency vacuum cleaner or a central vacuum can reduce allergen levels. Giving your dog or cat a bath at least once a week can reduce airborne allergen.  DUST MITE AVOIDANCE MEASURES:  There are three main measures that need and can be taken to avoid house dust mites:  Reduce accumulation of dust in general -reduce furniture, clothing, carpeting, books, stuffed animals, especially in bedroom  Separate yourself from the dust -use pillow and mattress encasements (can be found at stores such as [employer] or online) -avoid direct exposure to air condition flow -use a HEPA filter device, especially in the bedroom; you can also use a HEPA filter vacuum cleaner -wipe dust with a moist towel instead of a dry towel or broom when cleaning  Decrease mites and/or their secretions -wash clothing and linen and stuffed animals at highest temperature possible, at least every 2 weeks -stuffed animals can also  be placed in a bag and put in a freezer overnight  Despite the above measures, it is impossible to eliminate dust mites or their allergen completely from your home.  With the above measures the burden of mites in your home can be diminished, with the goal of minimizing your allergic symptoms.  Success will be reached only when  implementing and using all means together.  Reducing Pollen Exposure  The American Academy of Allergy, Asthma and Immunology suggests the following steps to reduce your exposure to pollen during allergy seasons.    Do not hang sheets or clothing out to dry; pollen may collect on these items. Do not mow lawns or spend time around freshly cut grass; mowing stirs up pollen. Keep windows closed at night.  Keep car windows closed while driving. Minimize morning activities outdoors, a time when pollen counts are usually at their highest. Stay indoors as much as possible when pollen counts or humidity is high and on windy days when pollen tends to remain in the air longer. Use air conditioning when possible.  Many air conditioners have filters that trap the pollen spores. Use a HEPA room air filter to remove pollen form the indoor air you breathe.

## 2021-10-18 NOTE — Addendum Note (Signed)
Addended by: Berna Bue on: 10/18/2021 03:13 PM   Modules accepted: Orders

## 2021-10-18 NOTE — Progress Notes (Signed)
NEW PATIENT Date of Service/Encounter:  10/18/21 Referring provider: Freada Bergeron, MD Primary care provider: Pcp, No  Subjective:  Martin Miles is a 35 y.o. male presenting today for evaluation of allergic reaction. History obtained from: chart review and patient.  Eyelid edema: Two separate occasions has had eye swelling (unilateral).   Eyes were itchy and felt like something was in it.  Symptoms relieved with Benadryl after 24 hours.  Happened once in August then again in September.  Hasn't happened since.  Chronic rhinitis:  Spring he notices increased sneezing and itchy eyes.   Using OTC AH for relief.   Asthma: Diagnosed as a young child.   Wheezing, tightness in lungs.  SOB.  Trouble breathing in. Never been hospitalized for his asthma.  No history of systemic steroids. Never required ED/UC for breathing issues.   Does not currently have albuterol inhaler, but does use his wife and children's inhalers with relief.  Within a few minutes will get some relief.  Chart Review:  2020: Status post septorhinoplasty and turbinate reduction  Past Medical History: Past Medical History:  Diagnosis Date   Anxiety    Asthma    Hypertension    PTSD (post-traumatic stress disorder)    trigger  - large crowds   Medication List:  Current Outpatient Medications  Medication Sig Dispense Refill   albuterol (VENTOLIN HFA) 108 (90 Base) MCG/ACT inhaler Inhale 2 puffs into the lungs every 6 (six) hours as needed for wheezing or shortness of breath. 8 g 2   cetirizine (ZYRTEC) 10 MG tablet Take 1 tablet (10 mg total) by mouth daily. 30 tablet 5   EPINEPHrine (EPIPEN 2-PAK) 0.3 mg/0.3 mL IJ SOAJ injection Inject 0.3 mg into the muscle once for 1 dose. 2 each 2   Olopatadine HCl (PATADAY) 0.2 % SOLN Place 1 drop into both eyes 1 day or 1 dose. 2.5 mL 5   ondansetron (ZOFRAN ODT) 4 MG disintegrating tablet Take 1 tablet (4 mg total) by mouth every 8 (eight) hours as needed for nausea or  vomiting. 10 tablet 0   sertraline (ZOLOFT) 25 MG tablet Take 50 mg by mouth daily.     No current facility-administered medications for this visit.   Known Allergies:  Allergies  Allergen Reactions   Remeron [Mirtazapine]    Past Surgical History: Past Surgical History:  Procedure Laterality Date   APPENDECTOMY     HERNIA REPAIR     NASAL SEPTUM SURGERY     Family History: Family History  Problem Relation Age of Onset   Asthma Mother    Allergic rhinitis Mother    Social History: Martin Miles lives in a house built 40 years ago, and + water damage, wood laminate floors, gas heating, central AC, pets: Cats, dogs, rats inside, outside pets: Peak, goats, chickens, dog. No pests, no dust mite protection on bedding, no tobacco smoke.  Works as a Musician, + [employer], [place].  ROS:  All other systems negative except as noted per HPI.  Objective:  Blood pressure (!) 134/96, pulse 71, temperature 98 F (36.7 C), temperature source Temporal, resp. rate 16, height 6\' 2"  (1.88 m), weight 281 lb 6.4 oz (127.6 kg), SpO2 98 %. Body mass index is 36.13 kg/m. Physical Exam:  General Appearance:  Alert, cooperative, no distress, appears stated age  Head:  Normocephalic, without obvious abnormality, atraumatic  Eyes:  Conjunctiva clear, EOM's intact  Nose: Nares normal, edematous turbinates with pink mucosa  Throat: Lips,  tongue normal; teeth and gums normal, normal posterior oropharynx  Neck: Supple, symmetrical  Lungs:   Clear to auscultation bilaterally, respirations unlabored, no coughing  Heart:  RRR, no murmur, Appears well perfused  Extremities: No edema  Skin: Skin color, texture, turgor normal, no rashes or lesions on visualized portions of skin  Neurologic: No gross deficits   Diagnostics: Spirometry:  Tracings reviewed. His effort: Good reproducible efforts. FVC: 4.80L (pre), 5.07L  (post) FEV1: 3.71L, 75% predicted (pre), 4.13L, 84% predicted  (post) FEV1/FVC ratio: 95% (pre), 100% (post) Interpretation:  No overt obstruction  with significant bronchodilator response Please see scanned spirometry results for details.  Skin Testing: Environmental allergy panel. Adequate positive and negative controls.al Results discussed with patient/family.  Airborne Adult Perc - 10/18/21 0931     Time Antigen Placed 0930    Allergen Manufacturer Waynette Buttery    Location Back    Number of Test 59    1. Control-Buffer 50% Glycerol Negative    2. Control-Histamine 1 mg/ml 3+    3. Albumin saline Negative    4. Bahia 4+    5. [place] 3+    6. Johnson 3+    7. [place] Blue 3+    8. Meadow Fescue 3+    9. Perennial Rye 3+    10. Sweet Vernal 3+    11. Timothy 4+    12. Cocklebur Negative    13. Burweed Marshelder Negative    14. Ragweed, short Negative    15. Ragweed, Giant Negative    16. Plantain,  English Negative    17. Lamb's Quarters Negative    18. Sheep Sorrell Negative    19. Rough Pigweed Negative    20. Marsh Elder, Rough Negative    21. Mugwort, Common Negative    22. Ash mix 3+    23. Birch mix 3+    24. Beech American 3+    25. Box, Elder Negative    26. Cedar, red Negative    27. Cottonwood, Guinea[place]issau Negative    28. Elm mix Negative    29. Hickory 3+    30. Maple mix Negative    31. Oak, [place] mix 3+    32. Pecan Pollen Negative    33. Pine mix Negative    34. Sycamore Eastern Negative    35. Walnut, Black Pollen Negative    36. Alternaria alternata Negative    37. Cladosporium Herbarum Negative    38. Aspergillus mix Negative    39. Penicillium mix Negative    40. Bipolaris sorokiniana (Helminthosporium) Negative    41. Drechslera spicifera (Curvularia) Negative    42. Mucor plumbeus Negative    43. Fusarium moniliforme Negative    44. Aureobasidium pullulans (pullulara) Negative    45. Rhizopus oryzae Negative    46. Botrytis cinera Negative    47. Epicoccum nigrum Negative    48. Phoma betae Negative     49. Candida Albicans Negative    50. Trichophyton mentagrophytes Negative    51. Mite, D Farinae  5,000 AU/ml 4+    52. Mite, D Pteronyssinus  5,000 AU/ml 4+    53. Cat Hair 10,000 BAU/ml 3+    54.  Dog Epithelia Negative    55. Mixed Feathers Negative    56. Horse Epithelia Negative    57. Cockroach, German Negative    58. Mouse Negative    59. Tobacco Leaf Negative             Allergy testing results  were read and interpreted by myself, documented by clinical staff.  Assessment and Plan   Patient Instructions  Chronic Rhinitis- seasonal and perennial allergic rhinitis-uncontrolled: - allergy testing today was positive to grasses, trees, dust mites and cat - allergen avoidance as below - consider allergy shots as long term control of your symptoms by teaching your immune system to be more tolerant of your allergy triggers - Continue over the counter antihistamine daily or daily as needed.  Your options include Zyrtec (Cetirizine) 10mg , Claritin (Loratadine) 10mg , Allegra (Fexofenadine) 180mg , or Xyzal (Levocetirinze) 5mg .  Prescription sent for cetirizine. - Consider nasal saline rinses to help remove pollens, mucus and hydrate nasal mucosa  Unilateral Alternating Eyelid Swelling: - unclear etiology, possibly related to cat or dust mites - if recurs, can take non-sedating antihistamine- Your options include: Zyrtec (cetirizine) 10 mg, Claritin (loratadine) 10 mg, Xyzal (levocetirizine) 5 mg or Allegra (fexofenadine) 180 mg daily as needed - Can also try pataday 1 drop in each eye as needed   Intermittent Asthma-controlled - your lung testing shows mild obstruction with significant response to bronchodilator, consistent with asthma - Use Albuterol (Proair/Ventolin) 2 puffs every 4-6 hours as needed for chest tightness, wheezing, or coughing - Use Albuterol (Proair/Ventolin) 2 puffs 15 minutes prior to exercise if you have symptoms with activity - Use a spacer with all  inhalers - please keep track of how often you are needing rescue inhaler Albuterol (Proair/Ventolin) as this will help guide future management - Asthma is not controlled if:  - Symptoms are occurring >2 times a week OR  - >2 times a month nighttime awakenings  - Please call the clinic to schedule a follow up if these symptoms arise  Follow-up in 4 to 6 months.  This note in its entirety was forwarded to the Provider who requested this consultation.  Thank you for your kind referral. I appreciate the opportunity to take part in Manan's care. Please do not hesitate to contact me with questions.  Sincerely,  , MD [facility]

## 2021-10-19 DIAGNOSIS — J301 Allergic rhinitis due to pollen: Secondary | ICD-10-CM | POA: Diagnosis not present

## 2021-10-19 NOTE — Progress Notes (Signed)
Aeroallergen Immunotherapy   Ordering Provider: Dr. Tonny Bollman   Patient Details  Name: Martin Miles  MRN: [id number]  Date of Birth: March 07, 1986   Order 1 of 2   Vial Label: g-t   0.3 ml (Volume)  BAU Concentration -- 7 Grass Mix* 100,000 ([address] [place], [place], [place], [place] Rye, RedTop, Sweet Vernal, Timothy)  0.2 ml (Volume)  1:20 Concentration -- Bahia  0.3 ml (Volume)  BAU Concentration -- [place] 10,000  0.2 ml (Volume)  1:20 Concentration -- Johnson  0.5 ml (Volume)  1:20 Concentration -- Eastern 10 Tree Mix (also Sweet Gum)    1.5  ml Extract Subtotal  3.5  ml Diluent  5.0  ml Maintenance Total   Schedule:  B  Blue Vial (1:100,000): Schedule B (6 doses)  Yellow Vial (1:10,000): Schedule B (6 doses)  Green Vial (1:1,000): Schedule B (6 doses)  Red Vial (1:100): Schedule A (10 doses)   Special Instructions: normal protocol

## 2021-10-19 NOTE — Progress Notes (Signed)
VIALS MADE. EXP 10-19-22 

## 2021-10-19 NOTE — Progress Notes (Signed)
Aeroallergen Immunotherapy   Ordering Provider: Dr. Tonny Bollman   Patient Details  Name: Martin Miles  MRN: [id number]  Date of Birth: [birth year]   Order 2 of 2   Vial Label: C-DM   0.5 ml (Volume)  1:10 Concentration -- Cat Hair  0.5 ml (Volume)   AU Concentration -- Mite Mix (DF 5,000 & DP 5,000)    1.0  ml Extract Subtotal  4.0  ml Diluent  5.0  ml Maintenance Total   Schedule:  B  Blue Vial (1:100,000): Schedule B (6 doses)  Yellow Vial (1:10,000): Schedule B (6 doses)  Green Vial (1:1,000): Schedule B (6 doses)  Red Vial (1:100): Schedule A (10 doses)   Special Instructions: normal protocol

## 2021-10-20 DIAGNOSIS — J3089 Other allergic rhinitis: Secondary | ICD-10-CM | POA: Diagnosis not present

## 2021-10-22 ENCOUNTER — Other Ambulatory Visit: Payer: Self-pay | Admitting: *Deleted

## 2021-10-22 MED ORDER — OLOPATADINE HCL 0.1 % OP SOLN: 1.0000 [drp] | Freq: Two times a day (BID) | OPHTHALMIC | 11 refills | Status: DC

## 2021-10-22 MED ORDER — OLOPATADINE HCL 0.2 % OP SOLN: 1.0000 [drp] | Freq: Every day | OPHTHALMIC | 5 refills | Status: DC | PRN

## 2021-11-15 ENCOUNTER — Ambulatory Visit (INDEPENDENT_AMBULATORY_CARE_PROVIDER_SITE_OTHER): Payer: No Typology Code available for payment source

## 2021-11-15 ENCOUNTER — Other Ambulatory Visit: Payer: Self-pay

## 2021-11-15 DIAGNOSIS — J309 Allergic rhinitis, unspecified: Secondary | ICD-10-CM

## 2021-11-15 NOTE — Progress Notes (Signed)
Immunotherapy   Patient Details  Name: Martin Miles MRN: [id number] Date of Birth: 04-Nov-1986  11/15/2021  Corie Chiquito Sulser started injections for Blue 1:100,000(G-T and C-D-DM) Following schedule: B  Frequency:1 time per week Epi-Pen:Epi-Pen Available  Consent signed and patient instructions given.   Gale Klar J Love Milbourne 11/15/2021, 1:48 PM

## 2022-02-15 NOTE — Progress Notes (Addendum)
? ?FOLLOW UP ?Date of Service/Encounter:  02/17/22 ? ? ?Subjective:  ?Martin Miles (DOB: 01/28/1986) is a 36 y.o. male who [facility] in re-evaluation of the following: seasonal and perennial allergic rhinitis and conjunctivitis, intermittent asthma ?History obtained from: chart review and patient. ? ?For Review, LV was on 10/18/21  with Dr.Bintou Lafata.  This is his initial appointment.  See diagnostics below.  We did discuss allergy shots which she started but had to discontinue due to family issues.  His wife was dealing with some health issues. ? ?Pertinent History/Diagnostics:  ?- Asthma:  ? - pre/post spirometry (10/18/21): ratio 95%, 75% FEV1 (pre), + 9% FEV1 (post) ?- Allergic Rhinitis:  ? - SPT environmental panel (10/18/21): grasses, trees, dust mites and cat ?-Started on SCIT,  on 11/15/21   but did not return. 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  ? ?Today he presents for follow-up. ?He would like to restart shots.   ?Burgess Estelle he got his Covid booster, so he is aware he will need to wait 48 hours prior to restarting. ?Using albuterol inhaler once or twice a week at most.  He is getting winded going up a flight of stairs.  He is noting a little more shortness of breath with certain activities-mostly moderate exercise. ? ?He is taking zyrtec daily to help with his allergies.  Winter is usually good time of year for him. ?He is no longer having any eye swelling.  He is using Flonase 2 SEN daily.   ?Occasionally uses afrin if nasal congestion is really bad.  ?He is on CPAP machine at night for OSA. ? ?ACT 17/25.   ?Allergies as of 02/17/2022   ? ?   Reactions  ? Remeron [mirtazapine] Itching, Swelling  ? ?  ? ?  ?Medication List  ?  ? ?  ? Accurate as of February 17, 2022  3:19 PM. If you have any questions, ask your nurse or doctor.  ?  ?  ? ?  ? ?albuterol 108 (90 Base) MCG/ACT inhaler ?Commonly  known as: VENTOLIN HFA ?Inhale 2 puffs into the lungs every 6 (six) hours as needed for wheezing or shortness of breath. ?  ?busPIRone 15 MG tablet ?Commonly known as: BUSPAR ?Take 15 mg by mouth 3 (three) times daily. ?  ?calcium carbonate 500 MG chewable tablet ?Commonly known as: TUMS - dosed in mg elemental calcium ?Chew 1 tablet by mouth as needed for indigestion or heartburn. ?  ?cetirizine 10 MG tablet ?Commonly known as: ZYRTEC ?Take 1 tablet (10 mg total) by mouth daily. ?  ?fluticasone 50 MCG/ACT nasal spray ?Commonly known as: FLONASE ?Place into the nose. ?  ?olopatadine 0.1 % ophthalmic solution ?Commonly known as: Patanol ?Place 1 drop into both eyes 2 (two) times daily. ?  ?ondansetron 4 MG disintegrating tablet ?Commonly known as: Zofran ODT ?Take 1 tablet (4 mg total) by mouth every 8 (eight) hours as needed for nausea or vomiting. ?  ?sertraline 25 MG tablet ?Commonly known as: ZOLOFT ?Take 50 mg by mouth daily. ?  ? ?  ? ?Past Medical History:  ?Diagnosis Date  ? Anxiety   ? Asthma   ? Hypertension   ? PTSD (post-traumatic stress disorder)   ? trigger  - large crowds  ? ?Past Surgical History:  ?Procedure Laterality Date  ? APPENDECTOMY    ? HERNIA REPAIR    ?  NASAL SEPTUM SURGERY    ? ?Otherwise, there have been no changes to his past medical history, surgical history, family history, or social history. ? ?ROS: All others negative except as noted per HPI.  ? ?Objective:  ?BP 130/80   Pulse (!) 105   Resp (!) 21   SpO2 98%  ?There is no height or weight on file to calculate BMI. ?Physical Exam: ?General Appearance:  Alert, cooperative, no distress, appears stated age  ?Head:  Normocephalic, without obvious abnormality, atraumatic  ?Eyes:  Conjunctiva clear, EOM's intact  ?Nose: Nares normal, hypertrophic turbinates, normal mucosa, and no visible anterior polyps  ?Throat: Lips, tongue normal; teeth and gums normal, normal posterior oropharynx  ?Neck: Supple, symmetrical  ?Lungs:   clear to  auscultation bilaterally, Respirations unlabored, no coughing  ?Heart:  regular rate and rhythm and no murmur, Appears well perfused  ?Extremities: No edema  ?Skin: Skin color, texture, turgor normal, no rashes or lesions on visualized portions of skin  ?Neurologic: No gross deficits  ? ?Spirometry:  ?Tracings reviewed. His effort: Good reproducible efforts. ?FVC: 4.65L ?FEV1: 3.85L, 78% predicted ?FEV1/FVC ratio: 100% ?Interpretation: Spirometry consistent with possible restrictive disease.  ?Please see scanned spirometry results for details. ? ?Assessment/Plan  ?His asthma is overall controlled and he is only using his albuterol a few times per week.  However his FEV1 remains in the mid 70s so we discussed using Symbicort as a rescue inhaler to ensure that he is getting some steroids with his rescue medication which has been shown to be more beneficial for asthmatics based on recent Gina and NHLBI guidelines. ?We are going to restart his allergy shots as he continues to be symptomatic from an allergic rhinitis and conjunctivitis standpoint.  He will return next week since he received a vaccine within the past 48 hours. ? ?Seasonal and perennial allergic rhinitis: uncontrolled ?- allergy testing today was positive to grasses, trees, dust mites and cat ?- allergen avoidance as below ?- restart allergy shots-come back next week to restart since you just had a vaccine ?- Continue over the counter antihistamine daily or daily as needed.  Your options include Zyrtec (Cetirizine) 10mg , Claritin (Loratadine) 10mg , Allegra (Fexofenadine) 180mg , or Xyzal (Levocetirinze) 5mg .  Prescription sent for cetirizine. ?- Consider nasal saline rinses to help remove pollens, mucus and hydrate nasal mucosa ?- Start Astelin (azelastine) use 1-2 sprays in each nostril up to two times daily as needed for NASAL CONGESTION/ITCHY NOSE. ? ?Allergic Conjunctivitis: resolved ?- unclear etiology, possibly related to cat or dust mites ?- if  recurs, can take non-sedating antihistamine- ?Your options include: Zyrtec (cetirizine) 10 mg, Claritin (loratadine) 10 mg, Xyzal (levocetirizine) 5 mg or Allegra (fexofenadine) 180 mg daily as needed ?- Can also try pataday 1 drop in each eye as needed  ? ?Intermittent Asthma: partially controlled ?- your lung testing shows mild obstruction ?- Rescue medications: Symbicort 80, 2 puffs every 4 to 6 hours as needed (up to 12 puffs maximum per day) ?- if needing more than twice a day, please schedule a follow-up. ?For asthma flares, respiratory illness: Start Symbicort 80 2 puffs twice a day for at least 1-2 weeks or until symptoms resolve ?- Use a spacer with all inhalers ?- please keep track of how often you are needing rescue inhaler Albuterol (Proair/Ventolin) as this will help guide future management ?- Asthma is not controlled if: ? - Symptoms are occurring >2 times a week OR ? - >2 times a month nighttime awakenings ? -  Please call the clinic to schedule a follow up if these symptoms arise ? ?Follow-up in 3 months, sooner if needed. ?It was a pleasure seeing you in clinic today.  ? ?Tonny Bollman, MD  ?[facility] ? ? ? ? ? ? ?

## 2022-02-17 ENCOUNTER — Ambulatory Visit (INDEPENDENT_AMBULATORY_CARE_PROVIDER_SITE_OTHER): Payer: No Typology Code available for payment source | Admitting: Internal Medicine

## 2022-02-17 ENCOUNTER — Encounter: Payer: Self-pay | Admitting: Internal Medicine

## 2022-02-17 ENCOUNTER — Ambulatory Visit: Payer: Self-pay

## 2022-02-17 ENCOUNTER — Other Ambulatory Visit: Payer: Self-pay

## 2022-02-17 VITALS — BP 130/80 | HR 105 | Resp 21

## 2022-02-17 DIAGNOSIS — H1013 Acute atopic conjunctivitis, bilateral: Secondary | ICD-10-CM

## 2022-02-17 DIAGNOSIS — J3089 Other allergic rhinitis: Secondary | ICD-10-CM | POA: Diagnosis not present

## 2022-02-17 DIAGNOSIS — J302 Other seasonal allergic rhinitis: Secondary | ICD-10-CM

## 2022-02-17 DIAGNOSIS — J4520 Mild intermittent asthma, uncomplicated: Secondary | ICD-10-CM

## 2022-02-17 MED ORDER — OLOPATADINE HCL 0.2 % OP SOLN: 1.0000 [drp] | Freq: Every day | OPHTHALMIC | 5 refills | Status: DC | PRN

## 2022-02-17 MED ORDER — BUDESONIDE-FORMOTEROL FUMARATE 80-4.5 MCG/ACT IN AERO: 2.0000 | INHALATION_SPRAY | RESPIRATORY_TRACT | 12 refills | Status: DC | PRN

## 2022-02-17 MED ORDER — AZELASTINE HCL 0.1 % NA SOLN
2.0000 | Freq: Two times a day (BID) | NASAL | 12 refills | Status: DC | PRN
Start: 2022-02-17 — End: 2023-04-13

## 2022-02-17 NOTE — Patient Instructions (Addendum)
Seasonal and perennial allergic rhinitis: ?- allergy testing today was positive to grasses, trees, dust mites and cat ?- allergen avoidance as below ?- restart allergy shots-come back next week to restart ?- Continue over the counter antihistamine daily or daily as needed.  Your options include Zyrtec (Cetirizine) 10mg , Claritin (Loratadine) 10mg , Allegra (Fexofenadine) 180mg , or Xyzal (Levocetirinze) 5mg .  Prescription sent for cetirizine. ?- Consider nasal saline rinses to help remove pollens, mucus and hydrate nasal mucosa ?- Start Astelin (azelastine) use 1-2 sprays in each nostril up to two times daily as needed for NASAL CONGESTION/ITCHY NOSE. ? ? ?Allergic Conjunctivitis: ?- unclear etiology, possibly related to cat or dust mites ?- if recurs, can take non-sedating antihistamine- ?Your options include: Zyrtec (cetirizine) 10 mg, Claritin (loratadine) 10 mg, Xyzal (levocetirizine) 5 mg or Allegra (fexofenadine) 180 mg daily as needed ?- Can also try pataday 1 drop in each eye as needed  ? ?Intermittent Asthma: ?- your lung testing shows mild obstruction  ?- Rescue medications: Symbicort 80, 2 puffs every 4 to 6 hours as needed (up to 12 puffs maximum per day) ?- if needing more than twice a day, please schedule a follow-up. ?For asthma flares, respiratory illness: Start Symbicort 80 2 puffs twice a day for at least 1-2 weeks or until symptoms resolve ?- Use a spacer with all inhalers ?- please keep track of how often you are needing rescue inhaler Albuterol (Proair/Ventolin) as this will help guide future management ?- Asthma is not controlled if: ? - Symptoms are occurring >2 times a week OR ? - >2 times a month nighttime awakenings ? - Please call the clinic to schedule a follow up if these symptoms arise ? ?Follow-up in 3 months, sooner if needed. ?It was a pleasure seeing you in clinic today.  ?------------------------------------------------------ ?Control of Dog or Cat Allergen ? ?Avoidance is the best  way to manage a dog or cat allergy. If you have a dog or cat and are allergic to dog or cats, consider removing the dog or cat from the home. ?If you have a dog or cat but don?t want to find it a new home, or if your family wants a pet even though someone in the household is allergic, here are some strategies that may help keep symptoms at bay: ? ?Keep the pet out of your bedroom and restrict it to only a few rooms. Be advised that keeping the dog or cat in only one room will not limit the allergens to that room. ?Don?t pet, hug or kiss the dog or cat; if you do, wash your hands with soap and water. ?High-efficiency particulate air (HEPA) cleaners run continuously in a bedroom or living room can reduce allergen levels over time. ?Regular use of a high-efficiency vacuum cleaner or a central vacuum can reduce allergen levels. ?Giving your dog or cat a bath at least once a week can reduce airborne allergen. ? ?DUST MITE AVOIDANCE MEASURES: ? ?There are three main measures that need and can be taken to avoid house dust mites: ? ?Reduce accumulation of dust in general ?-reduce furniture, clothing, carpeting, books, stuffed animals, especially in bedroom ? ?Separate yourself from the dust ?-use pillow and mattress encasements (can be found at stores such as [employer] or online) ?-avoid direct exposure to air condition flow ?-use a HEPA filter device, especially in the bedroom; you can also use a HEPA filter vacuum cleaner ?-wipe dust with a moist towel instead of a dry towel or broom  when cleaning ? ?Decrease mites and/or their secretions ?-wash clothing and linen and stuffed animals at highest temperature possible, at least every 2 weeks ?-stuffed animals can also be placed in a bag and put in a freezer overnight ? ?Despite the above measures, it is impossible to eliminate dust mites or their allergen completely from your home.  With the above measures the burden of mites in your home can be diminished, with  the goal of minimizing your allergic symptoms.  Success will be reached only when implementing and using all means together. ? ?Reducing Pollen Exposure ? ?The American Academy of Allergy, Asthma and Immunology suggests the following steps to reduce your exposure to pollen during allergy seasons. ?   ?Do not hang sheets or clothing out to dry; pollen may collect on these items. ?Do not mow lawns or spend time around freshly cut grass; mowing stirs up pollen. ?Keep windows closed at night.  Keep car windows closed while driving. ?Minimize morning activities outdoors, a time when pollen counts are usually at their highest. ?Stay indoors as much as possible when pollen counts or humidity is high and on windy days when pollen tends to remain in the air longer. ?Use air conditioning when possible.  Many air conditioners have filters that trap the pollen spores. ?Use a HEPA room air filter to remove pollen form the indoor air you breathe. ?  ?

## 2022-02-18 ENCOUNTER — Other Ambulatory Visit: Payer: Self-pay

## 2022-02-18 MED ORDER — OLOPATADINE HCL 0.1 % OP SOLN: 1.0000 [drp] | Freq: Two times a day (BID) | OPHTHALMIC | 5 refills | Status: DC

## 2022-02-18 MED ORDER — DULERA 100-5 MCG/ACT IN AERO: 2.0000 | INHALATION_SPRAY | Freq: Two times a day (BID) | RESPIRATORY_TRACT | 5 refills | Status: DC

## 2022-02-25 ENCOUNTER — Ambulatory Visit (INDEPENDENT_AMBULATORY_CARE_PROVIDER_SITE_OTHER): Payer: No Typology Code available for payment source

## 2022-02-25 DIAGNOSIS — J309 Allergic rhinitis, unspecified: Secondary | ICD-10-CM | POA: Diagnosis not present

## 2022-03-04 ENCOUNTER — Ambulatory Visit (INDEPENDENT_AMBULATORY_CARE_PROVIDER_SITE_OTHER): Payer: No Typology Code available for payment source

## 2022-03-04 DIAGNOSIS — J309 Allergic rhinitis, unspecified: Secondary | ICD-10-CM

## 2022-03-10 ENCOUNTER — Telehealth: Payer: Self-pay

## 2022-03-10 MED ORDER — ADVAIR HFA 115-21 MCG/ACT IN AERO: 2.0000 | INHALATION_SPRAY | Freq: Two times a day (BID) | RESPIRATORY_TRACT | 5 refills | Status: DC

## 2022-03-10 NOTE — Addendum Note (Signed)
Addended by: Berna Bue on: 03/10/2022 04:13 PM ? ? Modules accepted: Orders ? ?

## 2022-03-10 NOTE — Telephone Encounter (Signed)
Patient's Martin Miles was denied by insurance. The alternative drug is Advair (fluticasone/salmeterol), mometasone, or striverdi.  Please advise. ?

## 2022-03-10 NOTE — Telephone Encounter (Signed)
[facility] ?

## 2022-03-17 ENCOUNTER — Ambulatory Visit (INDEPENDENT_AMBULATORY_CARE_PROVIDER_SITE_OTHER): Payer: No Typology Code available for payment source

## 2022-03-17 DIAGNOSIS — J309 Allergic rhinitis, unspecified: Secondary | ICD-10-CM

## 2022-04-01 ENCOUNTER — Ambulatory Visit (INDEPENDENT_AMBULATORY_CARE_PROVIDER_SITE_OTHER): Payer: No Typology Code available for payment source | Admitting: *Deleted

## 2022-04-01 DIAGNOSIS — J309 Allergic rhinitis, unspecified: Secondary | ICD-10-CM

## 2022-04-28 ENCOUNTER — Ambulatory Visit (INDEPENDENT_AMBULATORY_CARE_PROVIDER_SITE_OTHER): Payer: No Typology Code available for payment source

## 2022-04-28 DIAGNOSIS — J309 Allergic rhinitis, unspecified: Secondary | ICD-10-CM | POA: Diagnosis not present

## 2022-05-20 ENCOUNTER — Ambulatory Visit (INDEPENDENT_AMBULATORY_CARE_PROVIDER_SITE_OTHER): Payer: No Typology Code available for payment source

## 2022-05-20 DIAGNOSIS — J309 Allergic rhinitis, unspecified: Secondary | ICD-10-CM

## 2022-05-23 ENCOUNTER — Ambulatory Visit (INDEPENDENT_AMBULATORY_CARE_PROVIDER_SITE_OTHER): Payer: No Typology Code available for payment source | Admitting: Internal Medicine

## 2022-05-23 ENCOUNTER — Encounter: Payer: Self-pay | Admitting: Internal Medicine

## 2022-05-23 VITALS — BP 140/90 | HR 109 | Resp 18

## 2022-05-23 DIAGNOSIS — J4520 Mild intermittent asthma, uncomplicated: Secondary | ICD-10-CM | POA: Diagnosis not present

## 2022-05-23 DIAGNOSIS — J302 Other seasonal allergic rhinitis: Secondary | ICD-10-CM

## 2022-05-23 DIAGNOSIS — H1013 Acute atopic conjunctivitis, bilateral: Secondary | ICD-10-CM

## 2022-05-23 DIAGNOSIS — J309 Allergic rhinitis, unspecified: Secondary | ICD-10-CM | POA: Diagnosis not present

## 2022-05-23 MED ORDER — OLOPATADINE HCL 0.1 % OP SOLN: 1.0000 [drp] | Freq: Two times a day (BID) | OPHTHALMIC | 11 refills | Status: DC

## 2022-05-23 MED ORDER — ALBUTEROL SULFATE HFA 108 (90 BASE) MCG/ACT IN AERS: 2.0000 | INHALATION_SPRAY | Freq: Four times a day (QID) | RESPIRATORY_TRACT | 2 refills | Status: DC | PRN

## 2022-05-23 MED ORDER — FLUTICASONE-SALMETEROL 115-21 MCG/ACT IN AERO: 2.0000 | INHALATION_SPRAY | Freq: Two times a day (BID) | RESPIRATORY_TRACT | 5 refills | Status: DC

## 2022-05-23 NOTE — Progress Notes (Signed)
FOLLOW UP Date of Service/Encounter:  05/23/22   Subjective:  Martin Miles (DOB: 01-21-1986) is a 36 y.o. male who [facility] in re-evaluation of the following: asthma, allergic rhinitis and conjunctivitis. History obtained from: chart review and patient.  For Review, LV was on 02/17/22  with Dr.Nichol Ator seen for regular follow-up.  He was interested in restarting his allergy injections which he had previously stopped due to family issues. He has since restarted.  Pertinent History/Diagnostics:  - Asthma:                 - pre/post spirometry (10/18/21): ratio 95%, 75% FEV1 (pre), + 9% FEV1 (post) - Allergic Rhinitis:                 - SPT environmental panel (10/18/21): grasses, trees, dust mites and cat -Started on SCIT,  on 11/15/21  but never returned. Restarted 03/17/22.  Planned for weekly, but has been coming monthly. 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 He has started back on allergy injections and is tolerating it really well.  He has missed a few, not having any issues.  Occasionally arm is itchy after getting shot for the rest of the day.  He does take his antihistamines before his injections. He does take his AH OTC every day.  His eyes have been doing well with using the eye drops when they become itchy or red. Asthma: controlled, has needed his Advair a few times the past month because he has been deep cleaning his house.  Used at least 4 or 5 days out of the month.  Did use it occasionally multiple times per day when he had a cold two weeks ago.     Allergies as of 05/23/2022       Reactions   Remeron [mirtazapine] Itching, Swelling        Medication List        Accurate as of May 23, 2022  2:16 PM. If you have any questions, ask your nurse or doctor.          Advair HFA 115-21 MCG/ACT  inhaler Generic drug: fluticasone-salmeterol Inhale 2 puffs into the lungs 2 (two) times daily.   albuterol 108 (90 Base) MCG/ACT inhaler Commonly known as: VENTOLIN HFA Inhale 2 puffs into the lungs every 6 (six) hours as needed for wheezing or shortness of breath.   azelastine 0.1 % nasal spray Commonly known as: ASTELIN Place 2 sprays into both nostrils 2 (two) times daily as needed for rhinitis or allergies. Use in each nostril as directed   budesonide-formoterol 80-4.5 MCG/ACT inhaler Commonly known as: Symbicort Inhale 2 puffs into the lungs as needed.   busPIRone 15 MG tablet Commonly known as: BUSPAR Take 15 mg by mouth 3 (three) times daily.   calcium carbonate 500 MG chewable tablet Commonly known as: TUMS - dosed in mg elemental calcium Chew 1 tablet by mouth as needed for indigestion or heartburn.   cetirizine 10 MG tablet Commonly known as: ZYRTEC Take 1 tablet (10 mg total) by mouth daily.   Dulera 100-5 MCG/ACT Aero Generic drug: mometasone-formoterol Inhale 2 puffs into the lungs 2 (two) times daily.   fluticasone 50 MCG/ACT nasal spray Commonly known as: FLONASE Place into the nose.   olopatadine 0.1 % ophthalmic solution Commonly known as:  Patanol Place 1 drop into both eyes 2 (two) times daily.   Olopatadine HCl 0.2 % Soln Apply 1 drop to eye daily as needed.   olopatadine 0.1 % ophthalmic solution Commonly known as: [facility] Olopatadine HCl Place 1 drop into both eyes 2 (two) times daily.   ondansetron 4 MG disintegrating tablet Commonly known as: Zofran ODT Take 1 tablet (4 mg total) by mouth every 8 (eight) hours as needed for nausea or vomiting.   sertraline 25 MG tablet Commonly known as: ZOLOFT Take 50 mg by mouth daily.       Past Medical History:  Diagnosis Date   Anxiety    Asthma    Hypertension    PTSD (post-traumatic stress disorder)    trigger  - large crowds   Past Surgical History:  Procedure Laterality Date    APPENDECTOMY     HERNIA REPAIR     NASAL SEPTUM SURGERY     Otherwise, there have been no changes to his past medical history, surgical history, family history, or social history.  ROS: All others negative except as noted per HPI.   Objective:  There were no vitals taken for this visit. There is no height or weight on file to calculate BMI. Physical Exam: General Appearance:  Alert, cooperative, no distress, appears stated age  Head:  Normocephalic, without obvious abnormality, atraumatic  Eyes:  Conjunctiva clear, EOM's intact  Nose: Nares normal, hypertrophic turbinates, normal mucosa, and no visible anterior polyps  Throat: Lips, tongue normal; teeth and gums normal, normal posterior oropharynx  Neck: Supple, symmetrical  Lungs:   clear to auscultation bilaterally, Respirations unlabored, no coughing  Heart:  regular rate and rhythm and no murmur, Appears well perfused  Extremities: No edema  Skin: Skin color, texture, turgor normal, no rashes or lesions on visualized portions of skin  Neurologic: No gross deficits  Spirometry:  Tracings reviewed. His effort: Good reproducible efforts. FVC: 4.93L FEV1: 3.92L, 80% predicted FEV1/FVC ratio: 99% Interpretation: Spirometry consistent with normal pattern.  Please see scanned spirometry results for details.  Assessment/Plan   Seasonal and perennial allergic rhinitis: controlled - allergen avoidance towards grasses, trees, dust mites and cat - continue allergy injections per schedule - Continue over the counter antihistamine daily or daily as needed.  Your options include Zyrtec (Cetirizine) 10mg , Claritin (Loratadine) 10mg , Allegra (Fexofenadine) 180mg , or Xyzal (Levocetirinze) 5mg .  Prescription sent for cetirizine. - Consider nasal saline rinses to help remove pollens, mucus and hydrate nasal mucosa - Continue Astelin (azelastine) use 1-2 sprays in each nostril up to two times daily as needed for NASAL CONGESTION/ITCHY  NOSE.  Allergic Conjunctivitis: controlled - continue pataday 1 drop in each eye as needed   Intermittent Asthma: controlled - your lung testing looked great - During asthma flares: Advair 115, 2 puffs twice daily for 2 weeks or until symptoms resolve - Rescue meds: albuterol 2 puffs every 4-6 hours as needed for coughing, shortness of breath or wheezing.  - Use a spacer with all inhalers - please keep track of how often you are needing rescue inhaler Albuterol (Proair/Ventolin) as this will help guide future management - Asthma is not controlled if:  - Symptoms are occurring >2 times a week OR  - >2 times a month nighttime awakenings  - Please call the clinic to schedule a follow up if these symptoms arise  Follow-up in 6 months, sooner if needed. It was a pleasure seeing you again in clinic today.   , MD  [facility]

## 2022-05-23 NOTE — Patient Instructions (Addendum)
Seasonal and perennial allergic rhinitis: - allergen avoidance towards grasses, trees, dust mites and cat - continue allergy injections per schedule - Continue over the counter antihistamine daily or daily as needed.  Your options include Zyrtec (Cetirizine) 10mg , Claritin (Loratadine) 10mg , Allegra (Fexofenadine) 180mg , or Xyzal (Levocetirinze) 5mg .  Prescription sent for cetirizine. - Consider nasal saline rinses to help remove pollens, mucus and hydrate nasal mucosa - Continue Astelin (azelastine) use 1-2 sprays in each nostril up to two times daily as needed for NASAL CONGESTION/ITCHY NOSE.  Allergic Conjunctivitis: - continue pataday 1 drop in each eye as needed   Intermittent Asthma: controlled - During asthma flares: Advair 115, 2 puffs twice daily for 2 weeks or until symptoms resolve - Rescue meds: albuterol 2 puffs every 4-6 hours as needed for coughing, shortness of breath or wheezing. - Use a spacer with all inhalers - please keep track of how often you are needing rescue inhaler Albuterol (Proair/Ventolin) as this will help guide future management - Asthma is not controlled if:  - Symptoms are occurring >2 times a week OR  - >2 times a month nighttime awakenings  - Please call the clinic to schedule a follow up if these symptoms arise  Follow-up in 6 months, sooner if needed. It was a pleasure seeing you again in clinic today.  ------------------------------------------------------ Control of Dog or Cat Allergen  Avoidance is the best way to manage a dog or cat allergy. If you have a dog or cat and are allergic to dog or cats, consider removing the dog or cat from the home. If you have a dog or cat but don't want to find it a new home, or if your family wants a pet even though someone in the household is allergic, here are some strategies that may help keep symptoms at bay:  Keep the pet out of your bedroom and restrict it to only a few rooms. Be advised that keeping the dog  or cat in only one room will not limit the allergens to that room. Don't pet, hug or kiss the dog or cat; if you do, wash your hands with soap and water. High-efficiency particulate air (HEPA) cleaners run continuously in a bedroom or living room can reduce allergen levels over time. Regular use of a high-efficiency vacuum cleaner or a central vacuum can reduce allergen levels. Giving your dog or cat a bath at least once a week can reduce airborne allergen.  DUST MITE AVOIDANCE MEASURES:  There are three main measures that need and can be taken to avoid house dust mites:  Reduce accumulation of dust in general -reduce furniture, clothing, carpeting, books, stuffed animals, especially in bedroom  Separate yourself from the dust -use pillow and mattress encasements (can be found at stores such as [employer] or online) -avoid direct exposure to air condition flow -use a HEPA filter device, especially in the bedroom; you can also use a HEPA filter vacuum cleaner -wipe dust with a moist towel instead of a dry towel or broom when cleaning  Decrease mites and/or their secretions -wash clothing and linen and stuffed animals at highest temperature possible, at least every 2 weeks -stuffed animals can also be placed in a bag and put in a freezer overnight  Despite the above measures, it is impossible to eliminate dust mites or their allergen completely from your home.  With the above measures the burden of mites in your home can be diminished, with the goal of minimizing your allergic  symptoms.  Success will be reached only when implementing and using all means together.  Reducing Pollen Exposure  The American Academy of Allergy, Asthma and Immunology suggests the following steps to reduce your exposure to pollen during allergy seasons.    Do not hang sheets or clothing out to dry; pollen may collect on these items. Do not mow lawns or spend time around freshly cut grass; mowing stirs  up pollen. Keep windows closed at night.  Keep car windows closed while driving. Minimize morning activities outdoors, a time when pollen counts are usually at their highest. Stay indoors as much as possible when pollen counts or humidity is high and on windy days when pollen tends to remain in the air longer. Use air conditioning when possible.  Many air conditioners have filters that trap the pollen spores. Use a HEPA room air filter to remove pollen form the indoor air you breathe.

## 2022-05-30 ENCOUNTER — Ambulatory Visit (INDEPENDENT_AMBULATORY_CARE_PROVIDER_SITE_OTHER): Payer: No Typology Code available for payment source

## 2022-05-30 DIAGNOSIS — J309 Allergic rhinitis, unspecified: Secondary | ICD-10-CM | POA: Diagnosis not present

## 2022-06-10 ENCOUNTER — Ambulatory Visit (INDEPENDENT_AMBULATORY_CARE_PROVIDER_SITE_OTHER): Payer: No Typology Code available for payment source | Admitting: *Deleted

## 2022-06-10 DIAGNOSIS — J309 Allergic rhinitis, unspecified: Secondary | ICD-10-CM

## 2022-10-05 ENCOUNTER — Telehealth: Payer: Self-pay | Admitting: Internal Medicine

## 2022-10-05 NOTE — Telephone Encounter (Signed)
Faxed renewal of authorization request to [facility], [phone] and emailed it to [email or web address].   Called and spoke to patient, advised him I would send renewal request. Advised patient to reach out to the [place] as well so that authorization may be approved. Patient verbalized understanding and stated he would do so.

## 2022-11-03 NOTE — Telephone Encounter (Signed)
Received updated authorization. Have updated it in system.  

## 2022-11-22 NOTE — Progress Notes (Unsigned)
FOLLOW UP Date of Service/Encounter:  11/22/22   Subjective:  Martin Miles (DOB: [birth year]) is a 36 y.o. male who [facility] in re-evaluation of the following:  asthma, allergic rhinitis and conjunctivitis.  History obtained from: chart review and {Persons; PED relatives w/patient:19415::"patient"}.  For Review, LV was on 05/23/22  with Dr.Shahrzad Koble seen for routine follow-up.  FEV1 80% predicted at that visit.  He was continued on allergy injections per protocol which she was tolerating well.  Had used Advair handful of times.  Pertinent History/Diagnostics:  Asthma:   - pre/post spirometry (10/18/21): ratio 95%, 75% FEV1 (pre), + 9% FEV1 (post) Allergic Rhinitis:   - SPT environmental panel (10/18/21): grasses, trees, dust mites and cat -Started on SCIT,  on 11/15/21  but never returned. Restarted 03/17/22.  Planned for weekly, but was coming monthly. 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 ***  Allergies as of 11/24/2022       Reactions   Remeron [mirtazapine] Itching, Swelling        Medication List        Accurate as of November 22, 2022  2:06 PM. If you have any questions, ask your nurse or doctor.          albuterol 108 (90 Base) MCG/ACT inhaler Commonly known as: VENTOLIN HFA Inhale 2 puffs into the lungs every 6 (six) hours as needed for wheezing or shortness of breath.   azelastine 0.1 % nasal spray Commonly known as: ASTELIN Place 2 sprays into both nostrils 2 (two) times daily as needed for rhinitis or allergies. Use in each nostril as directed   busPIRone 15 MG tablet Commonly known as: BUSPAR Take 15 mg by mouth 3 (three) times daily.   calcium carbonate 500 MG chewable tablet Commonly known as: TUMS - dosed in mg  elemental calcium Chew 1 tablet by mouth as needed for indigestion or heartburn.   cetirizine 10 MG tablet Commonly known as: ZYRTEC Take 1 tablet (10 mg total) by mouth daily.   fluticasone 50 MCG/ACT nasal spray Commonly known as: FLONASE Place into the nose.   fluticasone-salmeterol 115-21 MCG/ACT inhaler Commonly known as: Advair HFA Inhale 2 puffs into the lungs 2 (two) times daily.   olopatadine 0.1 % ophthalmic solution Commonly known as: Patanol Place 1 drop into both eyes 2 (two) times daily.   ondansetron 4 MG disintegrating tablet Commonly known as: Zofran ODT Take 1 tablet (4 mg total) by mouth every 8 (eight) hours as needed for nausea or vomiting.   sertraline 25 MG tablet Commonly known as: ZOLOFT Take 50 mg by mouth daily.       Past Medical History:  Diagnosis Date   Anxiety    Asthma    Hypertension    PTSD (post-traumatic stress disorder)    trigger  - large crowds   Past Surgical History:  Procedure Laterality Date   APPENDECTOMY     HERNIA REPAIR     NASAL SEPTUM SURGERY     Otherwise, there have been no changes to his past medical history, surgical history, family history, or social history.  ROS: All others negative except as noted per HPI.   Objective:  There were no vitals taken for this visit. There is no height or weight on file to calculate BMI. Physical Exam:  General Appearance:  Alert, cooperative, no distress, appears stated age  Head:  Normocephalic, without obvious abnormality, atraumatic  Eyes:  Conjunctiva clear, EOM's intact  Nose: Nares normal, {Blank multiple:19196:a:"***","hypertrophic turbinates","normal mucosa","no visible anterior polyps","septum midline"}  Throat: Lips, tongue normal; teeth and gums normal, {Blank multiple:19196:a:"***","normal posterior oropharynx","tonsils 2+","tonsils 3+","no tonsillar exudate","+ cobblestoning"}  Neck: Supple, symmetrical  Lungs:   {Blank multiple:19196:a:"***","clear to  auscultation bilaterally","end-expiratory wheezing","wheezing throughout"}, Respirations unlabored, {Blank multiple:19196:a:"***","no coughing","intermittent dry coughing"}  Heart:  {Blank multiple:19196:a:"***","regular rate and rhythm","no murmur"}, Appears well perfused  Extremities: No edema  Skin: Skin color, texture, turgor normal, no rashes or lesions on visualized portions of skin  Neurologic: No gross deficits   Reviewed: ***  Spirometry:  Tracings reviewed. His effort: {Blank single:19197::"Good reproducible efforts.","It was hard to get consistent efforts and there is a question as to whether this reflects a maximal maneuver.","Poor effort, data can not be interpreted.","Variable effort-results affected.","decent for first attempt at spirometry."} FVC: ***L FEV1: ***L, ***% predicted FEV1/FVC ratio: ***% Interpretation: {Blank single:19197::"Spirometry consistent with mild obstructive disease","Spirometry consistent with moderate obstructive disease","Spirometry consistent with severe obstructive disease","Spirometry consistent with possible restrictive disease","Spirometry consistent with mixed obstructive and restrictive disease","Spirometry uninterpretable due to technique","Spirometry consistent with normal pattern","No overt abnormalities noted given today's efforts"}.  Please see scanned spirometry results for details.  Skin Testing: {Blank single:19197::"Select foods","Environmental allergy panel","Environmental allergy panel and select foods","Food allergy panel","None","Deferred due to recent antihistamines use","deferred due to recent reaction"}. ***Adequate positive and negative controls Results discussed with patient/family.   {Blank single:19197::"Allergy testing results were read and interpreted by myself, documented by clinical staff."," "}  Assessment/Plan   ***  Tonny Bollman, MD  [facility]

## 2022-11-24 ENCOUNTER — Encounter: Payer: Self-pay | Admitting: Internal Medicine

## 2022-11-24 ENCOUNTER — Ambulatory Visit (INDEPENDENT_AMBULATORY_CARE_PROVIDER_SITE_OTHER): Payer: No Typology Code available for payment source | Admitting: Internal Medicine

## 2022-11-24 VITALS — BP 130/90 | HR 95 | Temp 97.9°F | Resp 16

## 2022-11-24 DIAGNOSIS — H1013 Acute atopic conjunctivitis, bilateral: Secondary | ICD-10-CM | POA: Diagnosis not present

## 2022-11-24 DIAGNOSIS — J4520 Mild intermittent asthma, uncomplicated: Secondary | ICD-10-CM

## 2022-11-24 DIAGNOSIS — J3089 Other allergic rhinitis: Secondary | ICD-10-CM | POA: Diagnosis not present

## 2022-11-24 NOTE — Patient Instructions (Addendum)
Seasonal and perennial allergic rhinitis: - allergen avoidance towards grasses, trees, dust mites and cat - restart allergy injections  - [facility] rapid desensitization for December 29th at 8:30 AM  - call insurance to ensure coverage - Continue Zyrtec (Cetirizine) 10mg  daily - Consider nasal saline rinses to help remove pollens, mucus and hydrate nasal mucosa - Continue Astelin (azelastine) use 1-2 sprays in each nostril up to two times daily as needed for NASAL CONGESTION/ITCHY NOSE.  Allergic Conjunctivitis: - continue pataday 1 drop in each eye as needed   Intermittent Asthma: controlled - During asthma flares: Advair 115, 2 puffs twice daily for 2 weeks or until symptoms resolve - Rescue meds: albuterol 2 puffs every 4-6 hours as needed for coughing, shortness of breath or wheezing. - Use a spacer with all inhalers - please keep track of how often you are needing rescue inhaler Albuterol (Proair/Ventolin) as this will help guide future management - Asthma is not controlled if:  - Symptoms are occurring >2 times a week OR  - >2 times a month nighttime awakenings  - Please call the clinic to schedule a follow up if these symptoms arise  Follow-up in 4 months, sooner if needed. It was a pleasure seeing you again in clinic today.  ------------------------------------------------------ Control of Dog or Cat Allergen  Avoidance is the best way to manage a dog or cat allergy. If you have a dog or cat and are allergic to dog or cats, consider removing the dog or cat from the home. If you have a dog or cat but don't want to find it a new home, or if your family wants a pet even though someone in the household is allergic, here are some strategies that may help keep symptoms at bay:  Keep the pet out of your bedroom and restrict it to only a few rooms. Be advised that keeping the dog or cat in only one room will not limit the allergens to that room. Don't pet, hug or kiss the dog or cat; if you  do, wash your hands with soap and water. High-efficiency particulate air (HEPA) cleaners run continuously in a bedroom or living room can reduce allergen levels over time. Regular use of a high-efficiency vacuum cleaner or a central vacuum can reduce allergen levels. Giving your dog or cat a bath at least once a week can reduce airborne allergen.  DUST MITE AVOIDANCE MEASURES:  There are three main measures that need and can be taken to avoid house dust mites:  Reduce accumulation of dust in general -reduce furniture, clothing, carpeting, books, stuffed animals, especially in bedroom  Separate yourself from the dust -use pillow and mattress encasements (can be found at stores such as [employer] or online) -avoid direct exposure to air condition flow -use a HEPA filter device, especially in the bedroom; you can also use a HEPA filter vacuum cleaner -wipe dust with a moist towel instead of a dry towel or broom when cleaning  Decrease mites and/or their secretions -wash clothing and linen and stuffed animals at highest temperature possible, at least every 2 weeks -stuffed animals can also be placed in a bag and put in a freezer overnight  Despite the above measures, it is impossible to eliminate dust mites or their allergen completely from your home.  With the above measures the burden of mites in your home can be diminished, with the goal of minimizing your allergic symptoms.  Success will be reached only when implementing and using  all means together.  Reducing Pollen Exposure  The American Academy of Allergy, Asthma and Immun[employer] suggests the following steps to reduce your exposure to pollen during allergy seasons.    Do not hang sheets or clothing out to dry; pollen may collect on these items. Do not mow lawns or spend time around freshly cut grass; mowing stirs up pollen. Keep windows closed at night.  Keep car windows closed while driving. Minimize morning activities  outdoors, a time when pollen counts are usually at their highest. Stay indoors as much as possible when pollen counts or humidity is high and on windy days when pollen tends to remain in the air longer. Use air conditioning when possible.  Many air conditioners have filters that trap the pollen spores. Use a HEPA room air filter to remove pollen form the indoor air you breathe.

## 2022-11-28 DIAGNOSIS — J302 Other seasonal allergic rhinitis: Secondary | ICD-10-CM

## 2022-11-28 NOTE — Progress Notes (Signed)
VIALS EXP 11-29-23 

## 2022-11-29 DIAGNOSIS — J3089 Other allergic rhinitis: Secondary | ICD-10-CM

## 2022-12-07 ENCOUNTER — Other Ambulatory Visit: Payer: Self-pay

## 2022-12-07 MED ORDER — MONTELUKAST SODIUM 10 MG PO TABS: ORAL_TABLET | ORAL | 0 refills | Status: DC

## 2022-12-07 MED ORDER — EPINEPHRINE 0.3 MG/0.3ML IJ SOAJ: 0.3000 mg | INTRAMUSCULAR | 0 refills | Status: DC | PRN

## 2022-12-07 MED ORDER — FAMOTIDINE 20 MG PO TABS: 20.0000 mg | ORAL_TABLET | Freq: Two times a day (BID) | ORAL | 0 refills | Status: DC

## 2022-12-07 MED ORDER — PREDNISONE 20 MG PO TABS: ORAL_TABLET | ORAL | 0 refills | Status: DC

## 2022-12-07 NOTE — Telephone Encounter (Signed)
Pre-med's sent to [facility] [address]

## 2022-12-14 ENCOUNTER — Other Ambulatory Visit: Payer: Self-pay

## 2022-12-14 MED ORDER — PREDNISONE 20 MG PO TABS: ORAL_TABLET | ORAL | 0 refills | Status: DC

## 2022-12-14 MED ORDER — FAMOTIDINE 20 MG PO TABS: 20.0000 mg | ORAL_TABLET | Freq: Two times a day (BID) | ORAL | 0 refills | Status: DC

## 2022-12-14 MED ORDER — EPINEPHRINE 0.3 MG/0.3ML IJ SOAJ: 0.3000 mg | INTRAMUSCULAR | 0 refills | Status: DC | PRN

## 2022-12-14 MED ORDER — MONTELUKAST SODIUM 10 MG PO TABS: ORAL_TABLET | ORAL | 0 refills | Status: DC

## 2022-12-14 NOTE — Progress Notes (Signed)
RAPID DESENSITIZATION Note  RE: NILO FALLIN MRN: [id number] DOB: 04-29-1986 Date of Office Visit: 12/16/2022  Subjective:  Patient presents today for rapid desensitization.  Interval History: Patient has not been ill, he has taken all premedications as per protocol.  Recent/Current History: Pulmonary disease: no Cardiac disease: no Respiratory infection: no Rash: no Itch: no Swelling: no Cough: no Shortness of breath: no Runny/stuffy nose: no Itchy eyes: no Beta-blocker use: no  Patient/guardian was informed of the procedure with verbalized understanding of the risk of anaphylaxis. Consent has been signed.   Medication List:  Current Outpatient Medications  Medication Sig Dispense Refill   albuterol (VENTOLIN HFA) 108 (90 Base) MCG/ACT inhaler Inhale 2 puffs into the lungs every 6 (six) hours as needed for wheezing or shortness of breath. 8 g 2   azelastine (ASTELIN) 0.1 % nasal spray Place 2 sprays into both nostrils 2 (two) times daily as needed for rhinitis or allergies. Use in each nostril as directed 30 mL 12   busPIRone (BUSPAR) 15 MG tablet Take 15 mg by mouth 3 (three) times daily.     cetirizine (ZYRTEC) 10 MG tablet Take 1 tablet (10 mg total) by mouth daily. 30 tablet 5   EPINEPHrine 0.3 mg/0.3 mL IJ SOAJ injection Inject 0.3 mg into the muscle as needed for anaphylaxis. 1 each 0   famotidine (PEPCID) 20 MG tablet Take 1 tablet (20 mg total) by mouth 2 (two) times daily. 4 tablet 0   fluticasone (FLONASE) 50 MCG/ACT nasal spray Place into the nose.     fluticasone-salmeterol (ADVAIR HFA) 115-21 MCG/ACT inhaler Inhale 2 puffs into the lungs 2 (two) times daily. 1 each 5   montelukast (SINGULAIR) 10 MG tablet Take 1 tab Thursday morning and 1 tab Friday morning before [facility] appt. 2 tablet 0   olopatadine (PATANOL) 0.1 % ophthalmic solution Place 1 drop into both eyes 2 (two) times daily. 5 mL 11   ondansetron (ZOFRAN ODT) 4 MG disintegrating tablet Take 1 tablet (4  mg total) by mouth every 8 (eight) hours as needed for nausea or vomiting. 10 tablet 0   predniSONE (DELTASONE) 20 MG tablet Take 2 tabs at Thursday morning and 2 tabs Friday morning before [facility] appt. 4 tablet 0   sertraline (ZOLOFT) 25 MG tablet Take 50 mg by mouth daily.     No current facility-administered medications for this visit.   Allergies: Allergies  Allergen Reactions   Remeron [Mirtazapine] Itching and Swelling   I reviewed his past medical history, social history, family history, and environmental history and no significant changes have been reported from his previous visit.  ROS: Negative except as per HPI.  Objective: BP (!) 140/92 (BP Location: Right Arm, Cuff Size: Large)   Pulse 86   Temp 97.9 F (36.6 C) (Temporal)   Resp 16   SpO2 97%  There is no height or weight on file to calculate BMI.   General Appearance:  Alert, cooperative, no distress, appears stated age  Head:  Normocephalic, without obvious abnormality, atraumatic  Eyes:  Conjunctiva clear, EOM's intact  Nose: Nares normal  Throat: Lips, tongue normal; teeth and gums normal, normal posterior oropharnyx  Neck: Supple, symmetrical  Lungs:   CTAB, Respirations unlabored, no coughing  Heart:  Appears well perfused  Extremities: No edema  Skin: Skin color, texture, turgor normal, no rashes or lesions on visualized portions of skin  Neurologic: No gross deficits     Diagnostics: PROCEDURES:  Patient received the following  doses every hour: Step 1:  0.44ml - 1:1,000,000 dilution (silver vial) Step 2:  0.108ml - 1:1,000,000 dilution (silver vial) Step 3: 0.88ml - 1:100,000 dilution (blue vial)  Step 4: 0.47ml - 1:100,000 dilution (blue vial)  Step 5: 0.34ml - 1:10,000 dilution (gold vial) Step 6: 0.60ml - 1:10,000 dilution (gold vial) Step 7: 0.3ml - 1:10,000 dilution (gold vial) Step 8: 0.86ml - 1:10,000 dilution (gold vial)  Patient was observed for 1 hour after the last dose.   Procedure started  at 08:50 AM Procedure ended at 03:55 PM   ASSESSMENT/PLAN:   Patient has tolerated the rapid desensitization protocol.  Next appointment: Start at 0.37ml of 1:1000 dilution (green vial) and build up per protocol.

## 2022-12-16 ENCOUNTER — Ambulatory Visit (INDEPENDENT_AMBULATORY_CARE_PROVIDER_SITE_OTHER): Payer: No Typology Code available for payment source | Admitting: Internal Medicine

## 2022-12-16 ENCOUNTER — Encounter: Payer: Self-pay | Admitting: Internal Medicine

## 2022-12-16 VITALS — BP 140/92 | HR 86 | Temp 97.9°F | Resp 16

## 2022-12-16 DIAGNOSIS — J3089 Other allergic rhinitis: Secondary | ICD-10-CM

## 2022-12-16 DIAGNOSIS — J302 Other seasonal allergic rhinitis: Secondary | ICD-10-CM

## 2022-12-26 ENCOUNTER — Ambulatory Visit (INDEPENDENT_AMBULATORY_CARE_PROVIDER_SITE_OTHER): Payer: No Typology Code available for payment source

## 2022-12-26 DIAGNOSIS — J309 Allergic rhinitis, unspecified: Secondary | ICD-10-CM

## 2023-03-23 ENCOUNTER — Encounter (HOSPITAL_BASED_OUTPATIENT_CLINIC_OR_DEPARTMENT_OTHER): Payer: Self-pay | Admitting: Pediatrics

## 2023-03-23 ENCOUNTER — Other Ambulatory Visit: Payer: Self-pay

## 2023-03-23 ENCOUNTER — Emergency Department (HOSPITAL_BASED_OUTPATIENT_CLINIC_OR_DEPARTMENT_OTHER): Payer: No Typology Code available for payment source

## 2023-03-23 ENCOUNTER — Emergency Department (HOSPITAL_BASED_OUTPATIENT_CLINIC_OR_DEPARTMENT_OTHER)
Admission: EM | Admit: 2023-03-23 | Discharge: 2023-03-23 | Disposition: A | Discharge status: Home / Self Care | Payer: No Typology Code available for payment source | Attending: Emergency Medicine | Admitting: Emergency Medicine

## 2023-03-23 DIAGNOSIS — N200 Calculus of kidney: Secondary | ICD-10-CM

## 2023-03-23 DIAGNOSIS — N132 Hydronephrosis with renal and ureteral calculous obstruction: Secondary | ICD-10-CM | POA: Insufficient documentation

## 2023-03-23 DIAGNOSIS — R1031 Right lower quadrant pain: Secondary | ICD-10-CM

## 2023-03-23 LAB — CBC WITH DIFFERENTIAL/PLATELET
Abs Immature Granulocytes: 0.03 10*3/uL (ref 0.00–0.07)
Basophils Absolute: 0.1 10*3/uL (ref 0.0–0.1)
Basophils Relative: 1 %
Eosinophils Absolute: 0.4 10*3/uL (ref 0.0–0.5)
Eosinophils Relative: 4 %
HCT: 44.9 % (ref 39.0–52.0)
Hemoglobin: 15 g/dL (ref 13.0–17.0)
Immature Granulocytes: 0 %
Lymphocytes Relative: 31 %
Lymphs Abs: 2.9 10*3/uL (ref 0.7–4.0)
MCH: 29 pg (ref 26.0–34.0)
MCHC: 33.4 g/dL (ref 30.0–36.0)
MCV: 86.8 fL (ref 80.0–100.0)
Monocytes Absolute: 0.6 10*3/uL (ref 0.1–1.0)
Monocytes Relative: 7 %
Neutro Abs: 5.3 10*3/uL (ref 1.7–7.7)
Neutrophils Relative %: 57 %
Platelets: 318 10*3/uL (ref 150–400)
RBC: 5.17 MIL/uL (ref 4.22–5.81)
RDW: 12 % (ref 11.5–15.5)
WBC: 9.4 10*3/uL (ref 4.0–10.5)
nRBC: 0 % (ref 0.0–0.2)

## 2023-03-23 LAB — URINALYSIS, ROUTINE W REFLEX MICROSCOPIC
Bilirubin Urine: NEGATIVE
Glucose, UA: NEGATIVE mg/dL
Ketones, ur: NEGATIVE mg/dL
Leukocytes,Ua: NEGATIVE
Nitrite: NEGATIVE
Protein, ur: NEGATIVE mg/dL
Specific Gravity, Urine: 1.015 (ref 1.005–1.030)
pH: 7 (ref 5.0–8.0)

## 2023-03-23 LAB — BASIC METABOLIC PANEL
Anion gap: 7 (ref 5–15)
BUN: 19 mg/dL (ref 6–20)
CO2: 25 mmol/L (ref 22–32)
Calcium: 8.8 mg/dL — ABNORMAL LOW (ref 8.9–10.3)
Chloride: 105 mmol/L (ref 98–111)
Creatinine, Ser: 1.26 mg/dL — ABNORMAL HIGH (ref 0.61–1.24)
GFR, Estimated: >60 mL/min (ref >60)
Glucose, Bld: 120 mg/dL — ABNORMAL HIGH (ref 70–99)
Potassium: 3.8 mmol/L (ref 3.5–5.1)
Sodium: 137 mmol/L (ref 135–145)

## 2023-03-23 LAB — URINALYSIS, MICROSCOPIC (REFLEX)

## 2023-03-23 LAB — LIPASE, BLOOD: Lipase: 30 U/L (ref 11–51)

## 2023-03-23 MED ORDER — IOHEXOL 300 MG/ML  SOLN
100.0000 mL | Freq: Once | INTRAMUSCULAR | Status: AC | PRN
  Administered 2023-03-23: 125 mL via INTRAVENOUS

## 2023-03-23 MED ORDER — ONDANSETRON HCL 4 MG/2ML IJ SOLN
4.0000 mg | Freq: Once | INTRAMUSCULAR | Status: AC
  Administered 2023-03-23: 4 mg via INTRAVENOUS
  Filled 2023-03-23: qty 2

## 2023-03-23 MED ORDER — MORPHINE SULFATE (PF) 4 MG/ML IV SOLN
4.0000 mg | Freq: Once | INTRAVENOUS | Status: AC
  Administered 2023-03-23: 4 mg via INTRAVENOUS
  Filled 2023-03-23: qty 1

## 2023-03-23 MED ORDER — SODIUM CHLORIDE 0.9 % IV BOLUS
1000.0000 mL | Freq: Once | INTRAVENOUS | Status: AC
  Administered 2023-03-23: 1000 mL via INTRAVENOUS

## 2023-03-23 MED ORDER — KETOROLAC TROMETHAMINE 15 MG/ML IJ SOLN
15.0000 mg | Freq: Once | INTRAMUSCULAR | Status: AC
  Administered 2023-03-23: 15 mg via INTRAVENOUS
  Filled 2023-03-23: qty 1

## 2023-03-23 NOTE — Discharge Instructions (Addendum)
Stay well-hydrated with water.  Use Tylenol 1000 mg every 4 hours and ibuprofen 600 mg every 6 hours for pain.  Return for fevers, uncontrolled pain or new concerns.  Follow-up with urology if symptoms do not resolve or you do not pass the stone. Your urine did not show signs of infection.

## 2023-03-23 NOTE — ED Triage Notes (Signed)
C/O sudden onset of R groin pain started at 4 am; reports pain  is constant and sharp in nature; with no relief with position changes. Reports hx of bilateral inguinal hernia, but this pain at the moment has no palpable bulge. Assoc symptoms of nausea,

## 2023-03-23 NOTE — ED Provider Notes (Signed)
[facility] [place] Provider Note   CSN: [id number] Arrival date & time: 03/23/23  [id number]     History  Chief Complaint  Patient presents with   Groin Pain    Martin Miles is a 37 y.o. male.  Patient with history of bilateral hernia repair presents with sudden onset right groin pain started 4:00 this morning woke him from sleep.  Sharp constant no history of similar.  No bulge appreciated however improves with putting pressure on the area.  Kidney stone history but this feels different.  No back pain.  No injury or lifting heavy things recently.  No symptoms in the legs.  No fevers or chills.  No urinary symptoms.  Patient had normal urination this morning.  Nausea.       Home Medications Prior to Admission medications   Medication Sig Start Date End Date Taking? Authorizing Provider  albuterol (VENTOLIN HFA) 108 (90 Base) MCG/ACT inhaler Inhale 2 puffs into the lungs every 6 (six) hours as needed for wheezing or shortness of breath. 05/23/22   Clemon Chambers, MD  azelastine (ASTELIN) 0.1 % nasal spray Place 2 sprays into both nostrils 2 (two) times daily as needed for rhinitis or allergies. Use in each nostril as directed 02/17/22   Clemon Chambers, MD  busPIRone (BUSPAR) 15 MG tablet Take 15 mg by mouth 3 (three) times daily.    [provider]  cetirizine (ZYRTEC) 10 MG tablet Take 1 tablet (10 mg total) by mouth daily. 10/18/21   Clemon Chambers, MD  EPINEPHrine 0.3 mg/0.3 mL IJ SOAJ injection Inject 0.3 mg into the muscle as needed for anaphylaxis. 12/14/22   Clemon Chambers, MD  famotidine (PEPCID) 20 MG tablet Take 1 tablet (20 mg total) by mouth 2 (two) times daily. 12/14/22   Clemon Chambers, MD  fluticasone [facility]) 50 MCG/ACT nasal spray Place into the nose.    [provider]  fluticasone-salmeterol (ADVAIR HFA) 115-21 MCG/ACT inhaler Inhale 2 puffs into the lungs 2 (two) times daily. 05/23/22   Clemon Chambers, MD  montelukast  (SINGULAIR) 10 MG tablet Take 1 tab Thursday morning and 1 tab Friday morning before [facility] appt. 12/14/22   Clemon Chambers, MD  olopatadine (PATANOL) 0.1 % ophthalmic solution Place 1 drop into both eyes 2 (two) times daily. 05/23/22   Clemon Chambers, MD  ondansetron (ZOFRAN ODT) 4 MG disintegrating tablet Take 1 tablet (4 mg total) by mouth every 8 (eight) hours as needed for nausea or vomiting. 05/19/21   Isla Pence, MD  predniSONE (DELTASONE) 20 MG tablet Take 2 tabs at Thursday morning and 2 tabs Friday morning before [facility] appt. 12/14/22   Clemon Chambers, MD  sertraline (ZOLOFT) 25 MG tablet Take 50 mg by mouth daily.    [provider]      Allergies    Remeron [mirtazapine]    Review of Systems   Review of Systems  Constitutional:  Negative for chills and fever.  HENT:  Negative for congestion.   Eyes:  Negative for visual disturbance.  Respiratory:  Negative for shortness of breath.   Cardiovascular:  Negative for chest pain.  Gastrointestinal:  Positive for abdominal pain and nausea. Negative for vomiting.  Genitourinary:  Negative for dysuria and flank pain.  Musculoskeletal:  Negative for back pain, neck pain and neck stiffness.  Skin:  Negative for rash.  Neurological:  Negative for light-headedness and headaches.    Physical  Exam Updated Vital Signs BP (!) 147/86   Pulse 64   Temp 97.9 F (36.6 C)   Resp 17   Ht 6\' 1"  (1.854 m)   Wt 131.5 kg   SpO2 96%   BMI 38.26 kg/m  Physical Exam Vitals and nursing note reviewed.  Constitutional:      General: He is not in acute distress.    Appearance: He is well-developed.  HENT:     Head: Normocephalic and atraumatic.     Mouth/Throat:     Mouth: Mucous membranes are moist.  Eyes:     General:        Right eye: No discharge.        Left eye: No discharge.     Conjunctiva/sclera: Conjunctivae normal.  Neck:     Trachea: No tracheal deviation.  Cardiovascular:     Rate and Rhythm: Normal rate.   Pulmonary:     Effort: Pulmonary effort is normal.  Abdominal:     General: There is no distension.     Palpations: Abdomen is soft.     Tenderness: There is no abdominal tenderness. There is no guarding.  Genitourinary:    Comments: Patient has mild tenderness right lower abdomen and right inguinal region without hernia/bulge appreciated.  No reproducible testicular pain or edema.  Normal position.  No rashes. Musculoskeletal:     Cervical back: Normal range of motion and neck supple. No rigidity.  Skin:    General: Skin is warm.     Capillary Refill: Capillary refill takes less than 2 seconds.     Findings: No rash.  Neurological:     General: No focal deficit present.     Mental Status: He is alert.     Cranial Nerves: No cranial nerve deficit.  Psychiatric:        Mood and Affect: Mood normal.     ED Results / Procedures / Treatments   Labs (all labs ordered are listed, but only abnormal results are displayed) Labs Reviewed  BASIC METABOLIC PANEL - Abnormal; Notable for the following components:      Result Value   Glucose, Bld 120 (*)    Creatinine, Ser 1.26 (*)    Calcium 8.8 (*)    All other components within normal limits  CBC WITH DIFFERENTIAL/PLATELET  LIPASE, BLOOD  URINALYSIS, ROUTINE W REFLEX MICROSCOPIC    EKG None  Radiology CT ABDOMEN PELVIS W CONTRAST  Result Date: 03/23/2023 CLINICAL DATA:  37 year old male with right lower quadrant pain, right groin pain since 0400 hours. Nausea. EXAM: CT ABDOMEN AND PELVIS WITH CONTRAST TECHNIQUE: Multidetector CT imaging of the abdomen and pelvis was performed using the standard protocol following bolus administration of intravenous contrast. RADIATION DOSE REDUCTION: This exam was performed according to the departmental dose-optimization program which includes automated exposure control, adjustment of the mA and/or kV according to patient size and/or use of iterative reconstruction technique. CONTRAST:  163mL  OMNIPAQUE IOHEXOL 300 MG/ML  SOLN COMPARISON:  CT Abdomen and Pelvis 10/09/2015. FINDINGS: Lower chest: Negative; minor atelectasis. Hepatobiliary: Possible Hepatic steatosis but otherwise negative liver and gallbladder. No bile duct enlargement. Pancreas: Negative. Spleen: Negative. Adrenals/Urinary Tract: Normal adrenal glands. 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 Left ureter is normal. Contralateral mild right hydronephrosis and mild right hydroureter with mild periureteral inflammation most apparent at the pelvic inlet. The right ureter remains mildly dilated into the  pelvis and to the bladder where a punctate calculus is visible just inside the right ureterovesical junction on coronal image 49 and series 2, image 92. No other right side urinary calculus. Bladder otherwise unremarkable. Stomach/Bowel: Decompressed and negative large bowel from the mid transverse distally. Appendectomy changes at the cecum series 2, image 54. No large bowel inflammation. Terminal ileum appears negative. No dilated small bowel. Decompressed, negative stomach and duodenum. No free air, free fluid, or mesenteric inflammation identified. Vascular/Lymphatic: Normal caliber abdominal aorta. Major arterial structures appear patent and normal. Portal venous system grossly patent. No lymphadenopathy identified. Reproductive: Negative. No inguinal hernia or inguinal inflammation. Other: No pelvis free fluid. Musculoskeletal: Negative. IMPRESSION: 1. Acute obstructive uropathy on the right due to a punctate stone located in the bladder, just inside the Right UVJ. Solitary left nephrolithiasis. 2. No other acute or inflammatory process identified in the abdomen or pelvis. No inguinal hernia. Prior appendectomy. Electronically Signed   By: Genevie Ann M.D.   On: 03/23/2023 08:46   US SCROTUM W/DOPPLER  Result Date: 03/23/2023 CLINICAL  DATA:  right groin pain EXAM: SCROTAL ULTRASOUND DOPPLER ULTRASOUND OF THE TESTICLES TECHNIQUE: Complete ultrasound examination of the testicles, epididymis, and other scrotal structures was performed. Color and spectral Doppler ultrasound were also utilized to evaluate blood flow to the testicles. COMPARISON:  None Available. FINDINGS: Right testicle Measurements: 4.9 x 2.5 x 2.9 cm. No mass or microlithiasis visualized. Left testicle Measurements: 4.7 x 2.4 x 3.0 cm. No mass or microlithiasis visualized. Right epididymis:  Normal in size and appearance. Left epididymis:  Normal in size and appearance. Hydrocele:  Trace right hydrocele. Varicocele:  None visualized. Pulsed Doppler interrogation of both testes demonstrates normal low resistance arterial and venous waveforms bilaterally. IMPRESSION: No evidence of testicular torsion or epididymo-orchitis. Electronically Signed   By: Maurine Simmering M.D.   On: 03/23/2023 08:27    Procedures Procedures    Medications Ordered in ED Medications  morphine (PF) 4 MG/ML injection 4 mg (4 mg Intravenous Given 03/23/23 0743)  ondansetron (ZOFRAN) injection 4 mg (4 mg Intravenous Given 03/23/23 0743)  sodium chloride 0.9 % bolus 1,000 mL ( Intravenous Stopped 03/23/23 0901)  iohexol (OMNIPAQUE) 300 MG/ML solution 100 mL (125 mLs Intravenous Contrast Given 03/23/23 0823)  ketorolac (TORADOL) 15 MG/ML injection 15 mg (15 mg Intravenous Given 03/23/23 [id number])    ED Course/ Medical Decision Making/ A&P                             Medical Decision Making Amount and/or Complexity of Data Reviewed Labs: ordered. Radiology: ordered.  Risk Prescription drug management.   Patient presents with more sudden onset right inguinal pain differential includes musculoskeletal, hernia, kidney stone, inflammation/epididymitis/orchitis however less likely with currently no testicular pain or tenderness, other.  Patient had appendix removed in the past.  Plan for blood work, IV fluids,  pain meds nausea meds CT scan and ultrasound the testicle.  Patient's pain improved significantly, nausea improved IV fluid bolus given.  Blood work ordered and independently reviewed reassuring mild elevated creatinine 1.3, this will likely improve with fluids.  Ultrasound results independently reviewed no torsion or fluid appreciated.  CT scan results independently reviewed showing punctate kidney stone which explains sudden onset pain.  Plan for Toradol, follow-up with urology.  Urinalysis pending and will discharge after review.        Final Clinical Impression(s) / ED Diagnoses Final diagnoses:  Right groin pain  Kidney stone on right side    Rx / DC Orders ED Discharge Orders     None         Elnora Morrison, MD 03/23/23 [phone]

## 2023-03-23 NOTE — ED Notes (Signed)
Ultrasound at bedside

## 2023-03-23 NOTE — ED Notes (Signed)
Patient transported to CT 

## 2023-04-02 NOTE — Progress Notes (Deleted)
FOLLOW UP Date of Service/Encounter:  04/02/23   Subjective:  Martin Miles (DOB: [birth year]) is a 37 y.o. male who [facility] in re-evaluation of the following: *** History obtained from: chart review and {Persons; PED relatives w/patient:19415::"patient"}.  For Review, LV was on 12/16/22  with Dr.Genie Mirabal seen for  [facility] immunotherapy appointment which he tolerated well . See below for summary of history and diagnostics.  He came for one additional appointment following [facility] on 12/26/22.   Pertinent History/Diagnostics:  Asthma:  Using Advair 115 2 puffs BID in block therapy during illness.   - pre/post spirometry (10/18/21): ratio 95%, 75% FEV1 (pre), + 9% FEV1 (post) Allergic Rhinitis:   - SPT environmental panel (10/18/21): grasses, trees, dust mites and cat -Started on SCIT,  on 11/15/21  but never returned. Restarted 03/17/22.  Planned for weekly, but was coming monthly. 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 Last injection 12/26/22.   - on CPAP for OSA  Today presents for follow-up. ***   Allergies as of 04/03/2023       Reactions   Remeron [mirtazapine] Itching, Swelling        Medication List        Accurate as of April 02, 2023  7:23 PM. If you have any questions, ask your nurse or doctor.          albuterol 108 (90 Base) MCG/ACT inhaler Commonly known as: VENTOLIN HFA Inhale 2 puffs into the lungs every 6 (six) hours as needed for wheezing or shortness of breath.   azelastine 0.1 % nasal spray Commonly known as: ASTELIN Place 2 sprays into both nostrils 2 (two) times daily as needed for rhinitis or allergies. Use in each nostril as directed   busPIRone 15 MG tablet Commonly known as: BUSPAR Take 15 mg by mouth 3 (three) times daily.   cetirizine 10 MG tablet Commonly known as:  ZYRTEC Take 1 tablet (10 mg total) by mouth daily.   EPINEPHrine 0.3 mg/0.3 mL Soaj injection Commonly known as: EPI-PEN Inject 0.3 mg into the muscle as needed for anaphylaxis.   famotidine 20 MG tablet Commonly known as: PEPCID Take 1 tablet (20 mg total) by mouth 2 (two) times daily.   fluticasone 50 MCG/ACT nasal spray Commonly known as: FLONASE Place into the nose.   fluticasone-salmeterol 115-21 MCG/ACT inhaler Commonly known as: Advair HFA Inhale 2 puffs into the lungs 2 (two) times daily.   montelukast 10 MG tablet Commonly known as: Singulair Take 1 tab Thursday morning and 1 tab Friday morning before [facility] appt.   olopatadine 0.1 % ophthalmic solution Commonly known as: Patanol Place 1 drop into both eyes 2 (two) times daily.   ondansetron 4 MG disintegrating tablet Commonly known as: Zofran ODT Take 1 tablet (4 mg total) by mouth every 8 (eight) hours as needed for nausea or vomiting.   predniSONE 20 MG tablet Commonly known as: DELTASONE Take 2 tabs at Thursday morning and 2 tabs Friday morning before [facility] appt.   sertraline 25 MG tablet Commonly known as: ZOLOFT Take 50 mg by mouth daily.       Past Medical History:  Diagnosis Date   Anxiety    Asthma    Hypertension    PTSD (post-traumatic stress disorder)    trigger  - large crowds   Past Surgical History:  Procedure Laterality Date   APPENDECTOMY     HERNIA REPAIR     NASAL SEPTUM SURGERY     Otherwise, there have been no changes to his past medical history, surgical history, family history, or social history.  ROS: All others negative except as noted per HPI.   Objective:  There were no vitals taken for this visit. There is no height or weight on file to calculate BMI. Physical Exam: General Appearance:  Alert, cooperative, no distress, appears stated age  Head:  Normocephalic, without obvious abnormality, atraumatic  Eyes:  Conjunctiva clear, EOM's intact  Nose: Nares normal, {Blank  multiple:19196:a:"***","hypertrophic turbinates","normal mucosa","no visible anterior polyps","septum midline"}  Throat: Lips, tongue normal; teeth and gums normal, {Blank multiple:19196:a:"***","normal posterior oropharynx","tonsils 2+","tonsils 3+","no tonsillar exudate","+ cobblestoning","surgically absent tonsils"}  Neck: Supple, symmetrical  Lungs:   {Blank multiple:19196:a:"***","clear to auscultation bilaterally","end-expiratory wheezing","wheezing throughout"}, Respirations unlabored, {Blank multiple:19196:a:"***","no coughing","intermittent dry coughing"}  Heart:  {Blank multiple:19196:a:"***","regular rate and rhythm","no murmur"}, Appears well perfused  Extremities: No edema  Skin: {Blank multiple:19196:a:"***","Skin color, texture, turgor normal","no rashes or lesions on visualized portions of skin"}  Neurologic: No gross deficits   Reviewed: ***  Spirometry:  Tracings reviewed. His effort: {Blank single:19197::"Good reproducible efforts.","It was hard to get consistent efforts and there is a question as to whether this reflects a maximal maneuver.","Poor effort, data can not be interpreted.","Variable effort-results affected.","decent for first attempt at spirometry."} FVC: ***L FEV1: ***L, ***% predicted FEV1/FVC ratio: ***% Interpretation: {Blank single:19197::"Spirometry consistent with mild obstructive disease","Spirometry consistent with moderate obstructive disease","Spirometry consistent with severe obstructive disease","Spirometry consistent with possible restrictive disease","Spirometry consistent with mixed obstructive and restrictive disease","Spirometry uninterpretable due to technique","Spirometry consistent with normal pattern","No overt abnormalities noted given today's efforts"}.  Please see scanned spirometry results for details.  Skin Testing: {Blank single:19197::"Select foods","Environmental allergy panel","Environmental allergy panel and select foods","Food  allergy panel","None","Deferred due to recent antihistamines use","deferred due to recent reaction"}. ***Adequate positive and negative controls Results discussed with patient/family.   {Blank single:19197::"Allergy testing results were read and interpreted by myself, documented by clinical staff."," "}  Assessment/Plan   ***  Tonny Bollman, MD  [facility]

## 2023-04-03 ENCOUNTER — Ambulatory Visit: Payer: No Typology Code available for payment source | Admitting: Internal Medicine

## 2023-04-11 NOTE — Progress Notes (Unsigned)
FOLLOW UP Date of Service/Encounter:  04/13/23   Subjective:  Martin Miles (DOB: [birth year]) is a 37 y.o. male who [facility]23 in re-evaluation of the following: asthma, allergic rhinitis and conjuncitivits History obtained from: chart review and patient.   For Review, LV was on 12/16/22  with Dr.Macrae Wiegman seen for  [facility] immunotherapy appointment which he tolerated well . See below for summary of history and diagnostics.  He came for one additional appointment following [facility] on 12/26/22.    Pertinent History/Diagnostics:  Asthma:  Using Advair 115 2 puffs BID in block therapy during illness.   - pre/post spirometry (10/18/21): ratio 95%, 75% FEV1 (pre), + 9% FEV1 (post) Allergic Rhinitis and conjunctivitis. :   - SPT environmental panel (10/18/21): grasses, trees, dust mites and cat -Started on SCIT,  on 11/15/21  but never returned. Restarted 03/17/22.  Planned for weekly, but was coming monthly. 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 Last injection 12/26/22. (2nd restart)  - on CPAP for OSA   Today presents for follow-up. He is alternating between allegra and claritin. He is using azelastine nasal spray as needed.  He forgot about his injections and was unable to come consistently, so we will hold on these for now. He is using pataday which is helping. He does feel his allergies are better controlled this year compared to last spring. He is using albuterol around once or twice per week. He is also using advair several times per week to help with need for albuterol.  He had a cold a few weeks back and during that time was using albuterol daily and his advair 2 puffs BID.  He feels this was effective. He continues to use his CPAP for OSA. Having some difficulty with daytime fatigue. Has not been seen for OSA in some  time. Reports drinking around 1 L or more of soda per day.    Allergies as of 04/13/2023       Reactions   Remeron [mirtazapine] Itching, Swelling        Medication List        Accurate as of April 13, 2023  1:19 PM. If you have any questions, ask your nurse or doctor.          STOP taking these medications    famotidine 20 MG tablet Commonly known as: PEPCID   montelukast 10 MG tablet Commonly known as: Singulair   predniSONE 20 MG tablet Commonly known as: DELTASONE       TAKE these medications    albuterol 108 (90 Base) MCG/ACT inhaler Commonly known as: VENTOLIN HFA Inhale 2 puffs into the lungs every 6 (six) hours as needed for wheezing or shortness of breath.   azelastine 0.1 % nasal spray Commonly known as: ASTELIN Place 2 sprays into both nostrils 2 (two) times daily as needed for rhinitis or allergies. Use in each nostril as directed   busPIRone 15 MG tablet Commonly known as: BUSPAR Take 15 mg by mouth 3 (three) times daily.   cetirizine 10 MG tablet Commonly known as: ZYRTEC Take 1 tablet (10 mg total) by mouth daily.   EPINEPHrine 0.3 mg/0.3 mL Soaj injection Commonly known as: EPI-PEN Inject 0.3 mg into the muscle as needed for anaphylaxis.   fluticasone 50 MCG/ACT nasal spray Commonly known as: FLONASE Place 2 sprays into both  nostrils daily. What changed:  how much to take how to take this when to take this   fluticasone-salmeterol 115-21 MCG/ACT inhaler Commonly known as: Advair HFA Inhale 2 puffs into the lungs 2 (two) times daily.   olopatadine 0.1 % ophthalmic solution Commonly known as: Patanol Place 1 drop into both eyes 2 (two) times daily as needed for allergies. What changed:  when to take this reasons to take this   ondansetron 4 MG disintegrating tablet Commonly known as: Zofran ODT Take 1 tablet (4 mg total) by mouth every 8 (eight) hours as needed for nausea or vomiting.   sertraline 25 MG tablet Commonly  known as: ZOLOFT Take 50 mg by mouth daily.       Past Medical History:  Diagnosis Date   Anxiety    Asthma    Hypertension    PTSD (post-traumatic stress disorder)    trigger  - large crowds   Past Surgical History:  Procedure Laterality Date   APPENDECTOMY     HERNIA REPAIR     NASAL SEPTUM SURGERY     Otherwise, there have been no changes to his past medical history, surgical history, family history, or social history.  ROS: All others negative except as noted per HPI.   Objective:  BP 126/84   Pulse 80   Temp 98.1 F (36.7 C) (Temporal)   Resp 17   Wt 288 lb 3.2 oz (130.7 kg)   SpO2 98%   BMI 38.02 kg/m  Body mass index is 38.02 kg/m. Physical Exam: General Appearance:  Alert, cooperative, no distress, appears stated age  Head:  Normocephalic, without obvious abnormality, atraumatic  Eyes:  Conjunctiva clear, EOM's intact  Nose: Nares normal, hypertrophic turbinates  Throat: Lips, tongue normal; teeth and gums normal, normal posterior oropharynx  Neck: Supple, symmetrical  Lungs:   clear to auscultation bilaterally, Respirations unlabored, no coughing  Heart:  regular rate and rhythm and no murmur, Appears well perfused  Extremities: No edema  Skin: Skin color, texture, turgor normal and no rashes or lesions on visualized portions of skin  Neurologic: No gross deficits  Spirometry:  Tracings reviewed. His effort: Good reproducible efforts. FVC: 5.07L FEV1: 4.25L, 87% predicted FEV1/FVC ratio: 104% Interpretation: Spirometry consistent with normal pattern.  Please see scanned spirometry results for details.  Assessment/Plan   Seasonal and perennial allergic rhinitis:  partially controlled - allergen avoidance towards grasses, trees, dust mites and cat - Continue Zyrtec (Cetirizine)  daily - Consider nasal saline rinses to help remove pollens, mucus and hydrate nasal mucosa - Continue Astelin (azelastine) use 1-2 sprays in each nostril up to two  times daily as needed for NASAL CONGESTION/ITCHY NOSE.  Allergic Conjunctivitis: partially controlled - continue pataday 1 drop in each eye as needed   Intermittent Asthma: not controlled Breathing test looked okay, history concerning for not well controlled asthma. - Controller Inhaler: Start Advair 115, 2 puffs twice daily. Use this every day.  Rinse mouth after use. - During flares: increase Advair 115 to 3-4 puffs twice daily and continue for 2 weeks or until symptoms resolve. - Rescue meds: albuterol 2 puffs every 4-6 hours as needed for coughing, shortness of breath or wheezing. - Use a spacer with all inhalers - please keep track of how often you are needing rescue inhaler Albuterol (Proair/Ventolin) as this will help guide future management - Asthma is not controlled if:  - Symptoms are occurring >2 times a week OR  - >2 times a month nighttime  awakenings  - Please call the clinic to schedule a follow up if these symptoms arise  Follow-up in 6 months, sooner if needed. It was a pleasure seeing you again in clinic today.   We also discussed lifestyle modifications, cutting back on sodas, limiting caffeine intake after noon, etc. Follow-up with sleep regarding OSA.   Tonny Bollman, MD  [facility]

## 2023-04-13 ENCOUNTER — Encounter: Payer: Self-pay | Admitting: Internal Medicine

## 2023-04-13 ENCOUNTER — Ambulatory Visit (INDEPENDENT_AMBULATORY_CARE_PROVIDER_SITE_OTHER): Payer: No Typology Code available for payment source | Admitting: Internal Medicine

## 2023-04-13 VITALS — BP 126/84 | HR 80 | Temp 98.1°F | Resp 17 | Wt 288.2 lb

## 2023-04-13 DIAGNOSIS — H1013 Acute atopic conjunctivitis, bilateral: Secondary | ICD-10-CM

## 2023-04-13 DIAGNOSIS — J302 Other seasonal allergic rhinitis: Secondary | ICD-10-CM

## 2023-04-13 DIAGNOSIS — J3089 Other allergic rhinitis: Secondary | ICD-10-CM

## 2023-04-13 DIAGNOSIS — J4520 Mild intermittent asthma, uncomplicated: Secondary | ICD-10-CM | POA: Diagnosis not present

## 2023-04-13 MED ORDER — FLUTICASONE PROPIONATE 50 MCG/ACT NA SUSP: 2.0000 | Freq: Every day | NASAL | 6 refills | Status: DC

## 2023-04-13 MED ORDER — CETIRIZINE HCL 10 MG PO TABS: 10.0000 mg | ORAL_TABLET | Freq: Every day | ORAL | 5 refills | Status: DC

## 2023-04-13 MED ORDER — AZELASTINE HCL 0.1 % NA SOLN: 2.0000 | Freq: Two times a day (BID) | NASAL | 5 refills | Status: DC | PRN

## 2023-04-13 MED ORDER — FLUTICASONE-SALMETEROL 115-21 MCG/ACT IN AERO: 2.0000 | INHALATION_SPRAY | Freq: Two times a day (BID) | RESPIRATORY_TRACT | 5 refills | Status: DC

## 2023-04-13 MED ORDER — ALBUTEROL SULFATE HFA 108 (90 BASE) MCG/ACT IN AERS: 2.0000 | INHALATION_SPRAY | Freq: Four times a day (QID) | RESPIRATORY_TRACT | 2 refills | Status: DC | PRN

## 2023-04-13 MED ORDER — OLOPATADINE HCL 0.1 % OP SOLN: 1.0000 [drp] | Freq: Two times a day (BID) | OPHTHALMIC | 5 refills | Status: DC | PRN

## 2023-04-13 NOTE — Patient Instructions (Addendum)
Seasonal and perennial allergic rhinitis: - allergen avoidance towards grasses, trees, dust mites and cat - Continue Zyrtec (Cetirizine)  daily - Consider nasal saline rinses to help remove pollens, mucus and hydrate nasal mucosa - Continue Astelin (azelastine) use 1-2 sprays in each nostril up to two times daily as needed for NASAL CONGESTION/ITCHY NOSE.  Allergic Conjunctivitis: - continue pataday 1 drop in each eye as needed   Intermittent Asthma: - Controller Inhaler: Start Advair 115, 2 puffs twice daily. Use this every day.  Rinse mouth after use. - During flares: increase Advair 115 to 3-4 puffs twice daily and continue for 2 weeks or until symptoms resolve. - Rescue meds: albuterol 2 puffs every 4-6 hours as needed for coughing, shortness of breath or wheezing. - Use a spacer with all inhalers - please keep track of how often you are needing rescue inhaler Albuterol (Proair/Ventolin) as this will help guide future management - Asthma is not controlled if:  - Symptoms are occurring >2 times a week OR  - >2 times a month nighttime awakenings  - Please call the clinic to schedule a follow up if these symptoms arise  Follow-up in 6 months, sooner if needed. It was a pleasure seeing you again in clinic today.  ------------------------------------------------------ Control of Dog or Cat Allergen  Avoidance is the best way to manage a dog or cat allergy. If you have a dog or cat and are allergic to dog or cats, consider removing the dog or cat from the home. If you have a dog or cat but don't want to find it a new home, or if your family wants a pet even though someone in the household is allergic, here are some strategies that may help keep symptoms at bay:  Keep the pet out of your bedroom and restrict it to only a few rooms. Be advised that keeping the dog or cat in only one room will not limit the allergens to that room. Don't pet, hug or kiss the dog or cat; if you do, wash  your hands with soap and water. High-efficiency particulate air (HEPA) cleaners run continuously in a bedroom or living room can reduce allergen levels over time. Regular use of a high-efficiency vacuum cleaner or a central vacuum can reduce allergen levels. Giving your dog or cat a bath at least once a week can reduce airborne allergen.  DUST MITE AVOIDANCE MEASURES:  There are three main measures that need and can be taken to avoid house dust mites:  Reduce accumulation of dust in general -reduce furniture, clothing, carpeting, books, stuffed animals, especially in bedroom  Separate yourself from the dust -use pillow and mattress encasements (can be found at stores such as [employer] or online) -avoid direct exposure to air condition flow -use a HEPA filter device, especially in the bedroom; you can also use a HEPA filter vacuum cleaner -wipe dust with a moist towel instead of a dry towel or broom when cleaning  Decrease mites and/or their secretions -wash clothing and linen and stuffed animals at highest temperature possible, at least every 2 weeks -stuffed animals can also be placed in a bag and put in a freezer overnight  Despite the above measures, it is impossible to eliminate dust mites or their allergen completely from your home.  With the above measures the burden of mites in your home can be diminished, with the goal of minimizing your allergic symptoms.  Success will be reached only when implementing and using all  means together.  Reducing Pollen Exposure  The American Academy of Allergy, Asthma and Immun[employer] suggests the following steps to reduce your exposure to pollen during allergy seasons.    Do not hang sheets or clothing out to dry; pollen may collect on these items. Do not mow lawns or spend time around freshly cut grass; mowing stirs up pollen. Keep windows closed at night.  Keep car windows closed while driving. Minimize morning activities outdoors, a  time when pollen counts are usually at their highest. Stay indoors as much as possible when pollen counts or humidity is high and on windy days when pollen tends to remain in the air longer. Use air conditioning when possible.  Many air conditioners have filters that trap the pollen spores. Use a HEPA room air filter to remove pollen form the indoor air you breathe.

## 2023-09-12 ENCOUNTER — Emergency Department (HOSPITAL_BASED_OUTPATIENT_CLINIC_OR_DEPARTMENT_OTHER)
Admission: EM | Admit: 2023-09-12 | Discharge: 2023-09-12 | Disposition: A | Discharge status: Home / Self Care | Payer: No Typology Code available for payment source | Attending: Emergency Medicine | Admitting: Emergency Medicine

## 2023-09-12 ENCOUNTER — Encounter (HOSPITAL_BASED_OUTPATIENT_CLINIC_OR_DEPARTMENT_OTHER): Payer: Self-pay | Admitting: Emergency Medicine

## 2023-09-12 ENCOUNTER — Other Ambulatory Visit: Payer: Self-pay

## 2023-09-12 DIAGNOSIS — R000 Tachycardia, unspecified: Secondary | ICD-10-CM | POA: Diagnosis not present

## 2023-09-12 DIAGNOSIS — K529 Noninfective gastroenteritis and colitis, unspecified: Secondary | ICD-10-CM | POA: Insufficient documentation

## 2023-09-12 DIAGNOSIS — Z20822 Contact with and (suspected) exposure to covid-19: Secondary | ICD-10-CM | POA: Insufficient documentation

## 2023-09-12 DIAGNOSIS — R112 Nausea with vomiting, unspecified: Secondary | ICD-10-CM | POA: Diagnosis present

## 2023-09-12 LAB — RESP PANEL BY RT-PCR (RSV, FLU A&B, COVID)  RVPGX2
Influenza A by PCR: NEGATIVE
Influenza B by PCR: NEGATIVE
Resp Syncytial Virus by PCR: NEGATIVE
SARS Coronavirus 2 by RT PCR: NEGATIVE

## 2023-09-12 LAB — CBC WITH DIFFERENTIAL/PLATELET
Abs Immature Granulocytes: 0.02 10*3/uL (ref 0.00–0.07)
Basophils Absolute: 0.1 10*3/uL (ref 0.0–0.1)
Basophils Relative: 1 %
Eosinophils Absolute: 0.1 10*3/uL (ref 0.0–0.5)
Eosinophils Relative: 1 %
HCT: 46.7 % (ref 39.0–52.0)
Hemoglobin: 16 g/dL (ref 13.0–17.0)
Immature Granulocytes: 0 %
Lymphocytes Relative: 14 %
Lymphs Abs: 1.2 10*3/uL (ref 0.7–4.0)
MCH: 29.3 pg (ref 26.0–34.0)
MCHC: 34.3 g/dL (ref 30.0–36.0)
MCV: 85.5 fL (ref 80.0–100.0)
Monocytes Absolute: 0.7 10*3/uL (ref 0.1–1.0)
Monocytes Relative: 8 %
Neutro Abs: 6.8 10*3/uL (ref 1.7–7.7)
Neutrophils Relative %: 76 %
Platelets: 261 10*3/uL (ref 150–400)
RBC: 5.46 MIL/uL (ref 4.22–5.81)
RDW: 12.2 % (ref 11.5–15.5)
WBC: 8.9 10*3/uL (ref 4.0–10.5)
nRBC: 0 % (ref 0.0–0.2)

## 2023-09-12 LAB — COMPREHENSIVE METABOLIC PANEL
ALT: 15 U/L (ref 0–44)
AST: 15 U/L (ref 15–41)
Albumin: 4.9 g/dL (ref 3.5–5.0)
Alkaline Phosphatase: 84 U/L (ref 38–126)
Anion gap: 10 (ref 5–15)
BUN: 15 mg/dL (ref 6–20)
CO2: 23 mmol/L (ref 22–32)
Calcium: 9.5 mg/dL (ref 8.9–10.3)
Chloride: 105 mmol/L (ref 98–111)
Creatinine, Ser: 1.42 mg/dL — ABNORMAL HIGH (ref 0.61–1.24)
GFR, Estimated: >60 mL/min (ref >60)
Glucose, Bld: 97 mg/dL (ref 70–99)
Potassium: 3.5 mmol/L (ref 3.5–5.1)
Sodium: 138 mmol/L (ref 135–145)
Total Bilirubin: 0.8 mg/dL (ref 0.3–1.2)
Total Protein: 7.9 g/dL (ref 6.5–8.1)

## 2023-09-12 LAB — LIPASE, BLOOD: Lipase: 11 U/L (ref 11–51)

## 2023-09-12 MED ORDER — ONDANSETRON HCL 4 MG/2ML IJ SOLN
4.0000 mg | Freq: Once | INTRAMUSCULAR | Status: AC
  Administered 2023-09-12: 4 mg via INTRAVENOUS
  Filled 2023-09-12: qty 2

## 2023-09-12 MED ORDER — LACTATED RINGERS IV BOLUS
2000.0000 mL | Freq: Once | INTRAVENOUS | Status: AC
  Administered 2023-09-12: 2000 mL via INTRAVENOUS

## 2023-09-12 NOTE — ED Provider Notes (Signed)
[facility] Provider Note   CSN: [id number] Arrival date & time: 09/12/23  4098     History  Chief Complaint  Patient presents with   Emesis   Diarrhea    Martin Miles is a 37 y.o. male.  37 year old male presents today for concern of nausea, vomiting, diarrhea ongoing for about 1 week.  He states symptoms have been consistent and has not worsened.  No associated abdominal pain.  No hematemesis, or blood in stool.  States he is unable to keep any p.o. intake down.  States his wife was recently sick with COVID.  The history is provided by the patient. No language interpreter was used.       Home Medications Prior to Admission medications   Medication Sig Start Date End Date Taking? Authorizing Provider  albuterol (VENTOLIN HFA) 108 (90 Base) MCG/ACT inhaler Inhale 2 puffs into the lungs every 6 (six) hours as needed for wheezing or shortness of breath. 04/13/23   Verlee Monte, MD  azelastine (ASTELIN) 0.1 % nasal spray Place 2 sprays into both nostrils 2 (two) times daily as needed for rhinitis or allergies. Use in each nostril as directed 04/13/23   Verlee Monte, MD  busPIRone (BUSPAR) 15 MG tablet Take 15 mg by mouth 3 (three) times daily.    [provider]  cetirizine (ZYRTEC) 10 MG tablet Take 1 tablet (10 mg total) by mouth daily. 04/13/23   Verlee Monte, MD  EPINEPHrine 0.3 mg/0.3 mL IJ SOAJ injection Inject 0.3 mg into the muscle as needed for anaphylaxis. 12/14/22   Verlee Monte, MD  fluticasone (FLONASE) 50 MCG/ACT nasal spray Place 2 sprays into both nostrils daily. 04/13/23   Verlee Monte, MD  fluticasone-salmeterol (ADVAIR HFA) [id number] MCG/ACT inhaler Inhale 2 puffs into the lungs 2 (two) times daily. 04/13/23   Verlee Monte, MD  olopatadine (PATANOL) 0.1 % ophthalmic solution Place 1 drop into both eyes 2 (two) times daily as needed for allergies. 04/13/23   Verlee Monte, MD  ondansetron (ZOFRAN ODT) 4 MG  disintegrating tablet Take 1 tablet (4 mg total) by mouth every 8 (eight) hours as needed for nausea or vomiting. 05/19/21   Jacalyn Lefevre, MD  sertraline (ZOLOFT) 25 MG tablet Take 50 mg by mouth daily.    [provider]      Allergies    Remeron [mirtazapine]    Review of Systems   Review of Systems  Constitutional:  Negative for chills and fever.  Respiratory:  Negative for shortness of breath.   Gastrointestinal:  Positive for diarrhea, nausea and vomiting. Negative for abdominal pain and constipation.  Genitourinary:  Negative for dysuria.  Neurological:  Negative for light-headedness.  All other systems reviewed and are negative.   Physical Exam Updated Vital Signs BP (!) 131/96 (BP Location: Right Arm)   Pulse (!) 110   Temp 98.3 F (36.8 C) (Oral)   Resp 16   Ht 6\' 1"  (1.854 m)   Wt 131.5 kg   SpO2 97%   BMI 38.26 kg/m  Physical Exam Vitals and nursing note reviewed.  Constitutional:      General: He is not in acute distress.    Appearance: Normal appearance. He is not ill-appearing.  HENT:     Head: Normocephalic and atraumatic.     Nose: Nose normal.  Eyes:     Conjunctiva/sclera: Conjunctivae normal.  Cardiovascular:     Rate and Rhythm:  Regular rhythm. Tachycardia present.  Pulmonary:     Effort: Pulmonary effort is normal. No respiratory distress.  Abdominal:     General: There is no distension.     Palpations: Abdomen is soft.     Tenderness: There is no abdominal tenderness. There is no guarding.  Musculoskeletal:        General: No deformity. Normal range of motion.     Cervical back: Normal range of motion.  Skin:    Findings: No rash.  Neurological:     Mental Status: He is alert.     ED Results / Procedures / Treatments   Labs (all labs ordered are listed, but only abnormal results are displayed) Labs Reviewed  RESP PANEL BY RT-PCR (RSV, FLU A&B, COVID)  [id number]  CBC WITH DIFFERENTIAL/PLATELET  COMPREHENSIVE METABOLIC PANEL   LIPASE, BLOOD    EKG None  Radiology No results found.  Procedures Procedures    Medications Ordered in ED Medications  ondansetron (ZOFRAN) injection 4 mg (has no administration in time range)  lactated ringers bolus 2,000 mL (2,000 mLs Intravenous New Bag/Given 09/12/23 1055)    ED Course/ Medical Decision Making/ A&P                                 Medical Decision Making Amount and/or Complexity of Data Reviewed Labs: ordered.  Risk Prescription drug management.   37 year old male presents today for concern of nausea, vomiting, diarrhea.  Ongoing for about a week.  Consistent with gastroenteritis.  No abdominal pain or tenderness.  Denies any blood in stool or hematemesis.  Has difficulty with p.o. intake.  Wife recently sick with URI.  Will obtain labs, provide fluid bolus, and provide Zofran and reevaluate.  Reevaluation he reports significant improvement.  He does have renal insufficiency with creatinine of 1.42.  This is likely above his baseline.  CMP otherwise without acute concern.  CBC unremarkable.  COVID, flu, RSV negative.  Lipase within normal limits.  Prior to p.o. challenge patient requested discharge.  He appears stable.  Discharged in stable condition.  Return precaution discussed.  Patient voices understanding and is in agreement with plan.   Final Clinical Impression(s) / ED Diagnoses Final diagnoses:  Gastroenteritis    Rx / DC Orders ED Discharge Orders     None         Marita Kansas, PA-C 09/12/23 1345    Melene Plan, DO 09/12/23 1348

## 2023-09-12 NOTE — ED Triage Notes (Addendum)
N/V/D since Monday.  Unknown fever, some chills yesterday.  Pt unable to keep fluids without V/D.  Pt states his wife just got over Covid

## 2023-09-12 NOTE — ED Notes (Signed)
Reviewed discharge instructions and follow up with pt. Pt states understanding 

## 2023-09-12 NOTE — Discharge Instructions (Signed)
Your workup today was very chronic.  Your kidney function was slightly elevated which is likely due to dehydration.  You received some fluids in the emergency room.  Continue to drink plenty of fluids at home.  Follow-up with your primary care provider.  For any concerning symptoms return to the emergency room.

## 2023-10-16 ENCOUNTER — Ambulatory Visit: Payer: No Typology Code available for payment source | Admitting: Internal Medicine

## 2023-10-18 ENCOUNTER — Encounter: Payer: Self-pay | Admitting: Internal Medicine

## 2023-10-18 ENCOUNTER — Ambulatory Visit (INDEPENDENT_AMBULATORY_CARE_PROVIDER_SITE_OTHER): Payer: No Typology Code available for payment source | Admitting: Internal Medicine

## 2023-10-18 VITALS — BP 128/86 | HR 78 | Temp 97.9°F | Resp 20 | Ht 74.0 in | Wt 284.1 lb

## 2023-10-18 DIAGNOSIS — J302 Other seasonal allergic rhinitis: Secondary | ICD-10-CM

## 2023-10-18 DIAGNOSIS — J4520 Mild intermittent asthma, uncomplicated: Secondary | ICD-10-CM

## 2023-10-18 DIAGNOSIS — J3089 Other allergic rhinitis: Secondary | ICD-10-CM | POA: Diagnosis not present

## 2023-10-18 DIAGNOSIS — H1013 Acute atopic conjunctivitis, bilateral: Secondary | ICD-10-CM | POA: Diagnosis not present

## 2023-10-18 MED ORDER — CETIRIZINE HCL 10 MG PO TABS: 10.0000 mg | ORAL_TABLET | Freq: Every day | ORAL | 5 refills | Status: DC

## 2023-10-18 MED ORDER — FLUTICASONE PROPIONATE 50 MCG/ACT NA SUSP: 2.0000 | Freq: Every day | NASAL | 6 refills | Status: DC

## 2023-10-18 MED ORDER — FLUTICASONE-SALMETEROL 115-21 MCG/ACT IN AERO: 2.0000 | INHALATION_SPRAY | Freq: Two times a day (BID) | RESPIRATORY_TRACT | 5 refills | Status: DC

## 2023-10-18 MED ORDER — AZELASTINE HCL 0.1 % NA SOLN: 2.0000 | Freq: Two times a day (BID) | NASAL | 5 refills | Status: DC | PRN

## 2023-10-18 MED ORDER — ALBUTEROL SULFATE HFA 108 (90 BASE) MCG/ACT IN AERS: 2.0000 | INHALATION_SPRAY | Freq: Four times a day (QID) | RESPIRATORY_TRACT | 2 refills | Status: DC | PRN

## 2023-10-18 NOTE — Progress Notes (Signed)
FOLLOW UP Date of Service/Encounter:  10/18/23  Subjective:  Martin Miles (DOB: 1986/01/27) is a 37 y.o. male who [facility] in re-evaluation of the following: asthma, allergic rhinitis and conjuncitivits  History obtained from: chart review and patient.  For Review, LV was on 04/13/23  with Dr.Iva Posten seen for routine follow-up. See below for summary of history and diagnostics.   Therapeutic plans/changes recommended: FEV1 87%, started Advair 115 ----------------------------------------------------- Pertinent History/Diagnostics:  Asthma:  Using Advair 115 2 puffs BID in block therapy during illness.   - pre/post spirometry (10/18/21): ratio 95%, 75% FEV1 (pre), + 9% FEV1 (post) - visit 04/24: switched to advair 115 daily Allergic Rhinitis and conjunctivitis. :   - SPT environmental panel (10/18/21): grasses, trees, dust mites and cat -Started on SCIT,  on 11/15/21  but never returned. Restarted 03/17/22.  Planned for weekly, but was coming monthly. 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 Last injection 12/26/22. (2nd restart) Unable to come consistently  - on CPAP for OSA -current meds: alternates between allegra and claritin,  --------------------------------------------------- Today presents for follow-up. Discussed the use of AI scribe software for clinical note transcription with the patient, who gave verbal consent to proceed.  History of Present Illness   The patient with a history of asthma reports a recent flare-up during a period of moving and exposure to dusty environments. Despite this, he describes his asthma as "doing pretty good." He has been using Advair, not daily, but more often than not, and has needed his rescue inhaler less than once a month. He reports fewer problems with fall and winter  allergens compared to spring and summer. He has not required prednisone or antibiotics since the last visit.  In addition to asthma, the patient also manages allergies with over-the-counter medications and nasal sprays, including Flonase and azelastine. He rotates between different over-the-counter allergy medications, avoiding Benadryl. The Flonase is used daily, while the azelastine is used as needed. He has not required any treatments for his eyes recently. He denies any recent episodes of severe allergic reactions resulting in swelling.      All medications reviewed by clinical staff and updated in chart. No new pertinent medical or surgical history except as noted in HPI.  ROS: All others negative except as noted per HPI.   Objective:  BP 128/86 (BP Location: Left Arm, Patient Position: Sitting, Cuff Size: Normal)   Pulse 78   Temp 97.9 F (36.6 C) (Temporal)   Resp 20   Ht 6\' 2"  (1.88 m)   Wt 284 lb 1.6 oz (128.9 kg)   SpO2 98%   BMI 36.48 kg/m  Body mass index is 36.48 kg/m. Physical Exam: General Appearance:  Alert, cooperative, no distress, appears stated age  Head:  Normocephalic, without obvious abnormality, atraumatic  Eyes:  Conjunctiva clear, EOM's intact  Ears EACs normal bilaterally and normal TMs bilaterally  Nose: Nares normal, hypertrophic turbinates  Throat: Lips, tongue normal; teeth and gums normal, normal posterior oropharynx  Neck: Supple, symmetrical  Lungs:   clear to auscultation bilaterally, Respirations unlabored, no coughing  Heart:  regular rate and rhythm and no murmur, Appears well perfused  Extremities: No edema  Skin: Skin color, texture, turgor normal and no rashes or lesions on visualized portions of skin  Neurologic: No gross deficits   Labs:  Lab Orders  No laboratory test(s) ordered today   Spirometry:  Tracings reviewed. His effort: Good reproducible efforts. FVC: 4.84L FEV1: 4.03L, 83% predicted FEV1/FVC ratio:  0.83 Interpretation: Spirometry consistent with normal pattern.  Please see scanned spirometry results for details.  Assessment/Plan   Seasonal and perennial allergic rhinitis: at goal - allergen avoidance towards grasses, trees, dust mites and cat - Continue Zyrtec (Cetirizine) 10mg  daily - Consider nasal saline rinses to help remove pollens, mucus and hydrate nasal mucosa - Continue Astelin (azelastine) use 1-2 sprays in each nostril up to two times daily as needed for NASAL CONGESTION/ITCHY NOSE - continue Flovent (flonase) 1-2 spray in each nostril daily.  Allergic Conjunctivitis: at goal - continue pataday 1 drop in each eye twice daily as needed   Intermittent Asthma: at goal - Controller Inhaler: Advair 115, 2 puffs twice daily. Use this every day.  Rinse mouth after use. - During flares: increase Advair 115 to 3-4 puffs twice daily and continue for 2 weeks or until symptoms resolve. - Rescue meds: albuterol 2 puffs every 4-6 hours as needed for coughing, shortness of breath or wheezing. - Use a spacer with all inhalers - please keep track of how often you are needing rescue inhaler Albuterol (Proair/Ventolin) as this will help guide future management - Asthma is not controlled if:  - Symptoms are occurring >2 times a week OR  - >2 times a month nighttime awakenings  - Please call the clinic to schedule a follow up if these symptoms arise  Follow-up in 6 months, sooner if needed. It was a pleasure seeing you again in clinic today.   Other: none Tonny Bollman, MD  [facility]

## 2023-10-18 NOTE — Patient Instructions (Addendum)
Seasonal and perennial allergic rhinitis: - allergen avoidance towards grasses, trees, dust mites and cat - Continue Zyrtec (Cetirizine) 10mg  daily - Consider nasal saline rinses to help remove pollens, mucus and hydrate nasal mucosa - Continue Astelin (azelastine) use 1-2 sprays in each nostril up to two times daily as needed for NASAL CONGESTION/ITCHY NOSE - continue Flovent (flonase) 1-2 spray in each nostril daily.  Allergic Conjunctivitis: - continue pataday 1 drop in each eye twice daily as needed   Intermittent Asthma: - Controller Inhaler: Advair 115, 2 puffs twice daily. Use this every day.  Rinse mouth after use. - During flares: increase Advair 115 to 3-4 puffs twice daily and continue for 2 weeks or until symptoms resolve. - Rescue meds: albuterol 2 puffs every 4-6 hours as needed for coughing, shortness of breath or wheezing. - Use a spacer with all inhalers - please keep track of how often you are needing rescue inhaler Albuterol (Proair/Ventolin) as this will help guide future management - Asthma is not controlled if:  - Symptoms are occurring >2 times a week OR  - >2 times a month nighttime awakenings  - Please call the clinic to schedule a follow up if these symptoms arise  Follow-up in 6 months, sooner if needed. It was a pleasure seeing you again in clinic today.

## 2023-11-07 ENCOUNTER — Telehealth: Payer: Self-pay

## 2023-11-07 NOTE — Telephone Encounter (Signed)
New auth request has been sent to the [facility] & Dr Maurine Minister for a signature. Once received back I can send his auth request to the [facility] with his last office note.   Patients next visit is scheduled for 04/17/2024

## 2023-11-08 NOTE — Telephone Encounter (Signed)
Request has been sent to the [facility].

## 2023-11-08 NOTE — Telephone Encounter (Signed)
Signed forms - let me know if you need anything else.

## 2023-12-29 NOTE — Telephone Encounter (Signed)
 I have emailed Kent at the [facility] for an update.

## 2024-04-17 ENCOUNTER — Encounter: Payer: Self-pay | Admitting: Internal Medicine

## 2024-04-17 ENCOUNTER — Other Ambulatory Visit: Payer: Self-pay

## 2024-04-17 ENCOUNTER — Ambulatory Visit (INDEPENDENT_AMBULATORY_CARE_PROVIDER_SITE_OTHER): Payer: No Typology Code available for payment source | Admitting: Internal Medicine

## 2024-04-17 VITALS — BP 134/76 | HR 87 | Temp 98.0°F | Resp 18 | Ht 74.0 in | Wt 277.8 lb

## 2024-04-17 DIAGNOSIS — J302 Other seasonal allergic rhinitis: Secondary | ICD-10-CM

## 2024-04-17 DIAGNOSIS — H1013 Acute atopic conjunctivitis, bilateral: Secondary | ICD-10-CM

## 2024-04-17 DIAGNOSIS — J4520 Mild intermittent asthma, uncomplicated: Secondary | ICD-10-CM | POA: Diagnosis not present

## 2024-04-17 DIAGNOSIS — J3089 Other allergic rhinitis: Secondary | ICD-10-CM | POA: Diagnosis not present

## 2024-04-17 MED ORDER — FLUTICASONE-SALMETEROL 115-21 MCG/ACT IN AERO: 2.0000 | INHALATION_SPRAY | Freq: Two times a day (BID) | RESPIRATORY_TRACT | 5 refills | Status: AC

## 2024-04-17 MED ORDER — OLOPATADINE HCL 0.1 % OP SOLN: 1.0000 [drp] | Freq: Two times a day (BID) | OPHTHALMIC | 5 refills | Status: AC | PRN

## 2024-04-17 MED ORDER — AZELASTINE HCL 0.1 % NA SOLN: 2.0000 | Freq: Two times a day (BID) | NASAL | 5 refills | Status: AC | PRN

## 2024-04-17 MED ORDER — FLUTICASONE PROPIONATE 50 MCG/ACT NA SUSP: 2.0000 | Freq: Every day | NASAL | 6 refills | Status: AC

## 2024-04-17 MED ORDER — CETIRIZINE HCL 10 MG PO TABS: 10.0000 mg | ORAL_TABLET | Freq: Every day | ORAL | 5 refills | Status: AC

## 2024-04-17 MED ORDER — ALBUTEROL SULFATE HFA 108 (90 BASE) MCG/ACT IN AERS: 2.0000 | INHALATION_SPRAY | Freq: Four times a day (QID) | RESPIRATORY_TRACT | 2 refills | Status: AC | PRN

## 2024-04-17 NOTE — Patient Instructions (Addendum)
 Seasonal and perennial allergic rhinitis: - allergen avoidance towards grasses, trees, dust mites and cat - Continue Zyrtec  (Cetirizine ) 10mg  daily. Your options include: Zyrtec  (cetirizine ) 10 mg, Claritin (loratadine) 10 mg, Xyzal (levocetirizine) 5 mg or Allegra (fexofenadine) 180 mg daily as needed - Consider nasal saline rinses to help remove pollens, mucus and hydrate nasal mucosa - Continue Astelin  (azelastine ) use 1-2 sprays in each nostril up to two times daily as needed for NASAL CONGESTION/ITCHY NOSE - continue Flonase  (fluticasone ) 1-2 spray in each nostril daily.  Allergic Conjunctivitis: - continue pataday  1 drop in each eye twice daily as needed   Intermittent Asthma: - Controller Inhaler: Advair  115, 2 puffs twice daily. Use this every day.  Rinse mouth after use. - During flares: increase Advair  115 to 3-4 puffs twice daily and continue for 2 weeks or until symptoms resolve. - Rescue meds: albuterol  2 puffs every 4-6 hours as needed for coughing, shortness of breath or wheezing. - Use a spacer with all inhalers - please keep track of how often you are needing rescue inhaler Albuterol  (Proair /Ventolin ) as this will help guide future management - Asthma is not controlled if:  - Symptoms are occurring >2 times a week OR  - >2 times a month nighttime awakenings  - Please call the clinic to schedule a follow up if these symptoms arise  Follow-up in 6 months, sooner if needed. It was a pleasure seeing you again in clinic today.

## 2024-04-17 NOTE — Progress Notes (Signed)
 FOLLOW UP Date of Service/Encounter:  04/17/24  Subjective:  Martin Miles (DOB: 05/02/1986) is a 38 y.o. male who [facility] in re-evaluation of the following: asthma, allergic rhinitis History obtained from: chart review and patient.  For Review, LV was on 10/18/23  with Dr.Alanie Syler seen for routine follow-up. See below for summary of history and diagnostics.   Therapeutic plans/changes recommended: FEV1 83% ----------------------------------------------------- Pertinent History/Diagnostics:  Asthma:  Using Advair  115 2 puffs BID in block therapy during illness.   - pre/post spirometry (10/18/21): ratio 95%, 75% FEV1 (pre), + 9% FEV1 (post) - visit 04/24: switched to advair  115 daily Allergic Rhinitis and conjunctivitis. :   - SPT environmental panel (10/18/21): grasses, trees, dust mites and cat -Started on SCIT,  on 11/15/21  but never returned. Restarted 03/17/22.  Planned for weekly, but was coming monthly. 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 Last injection 12/26/22. (2nd restart) Unable to come consistently. Did not restart.  - on CPAP for OSA -current meds: alternates between allegra and claritin,  --------------------------------------------------- Today presents for follow-up.  History of Present Illness   Martin Miles is a 38 year old male with asthma who presents for allergy  and asthma management.  Asthma has been well-controlled with no significant issues until recent weather changes and exposure to allergens such as dust while cleaning the garage. He uses albuterol  as a rescue inhaler approximately once a month and has not required steroids or antibiotics since his last visit. He continues to use Advair  115, two puffs twice a day.  He experiences intermittent allergy  symptoms, including itchy eyes  and nose, which vary daily. He takes Zyrtec  as needed, although he sometimes forgets to take it. He has not been using nasal sprays regularly. Some allergens may have been related to his previous home, which was older and possibly had more dust mites. He currently lives in a newer home and has cats.  He started taking a weight loss medication, Wegovy, in November, which he describes as 'going pretty good'.  Socially, he has three children aged almost sixteen, thirteen, and almost eleven. He describes the challenges of managing their school schedules and anticipates some relief when his oldest child begins driving next year.      All medications reviewed by clinical staff and updated in chart. No new pertinent medical or surgical history except as noted in HPI.  ROS: All others negative except as noted per HPI.   Objective:  BP 134/76   Pulse 87   Temp 98 F (36.7 C) (Temporal)   Resp 18   Ht 6\' 2"  (1.88 m)   Wt 277 lb 12.8 oz (126 kg)   SpO2 98%   BMI 35.67 kg/m  Body mass index is 35.67 kg/m. Physical Exam: General Appearance:  Alert, cooperative, no distress, appears stated age  Head:  Normocephalic, without obvious abnormality, atraumatic  Eyes:  Conjunctiva clear, EOM's intact  Ears EACs normal bilaterally and normal TMs bilaterally  Nose: Nares normal, hypertrophic turbinates, normal mucosa, and no visible anterior polyps  Throat: Lips, tongue normal; teeth and gums normal, normal posterior oropharynx  Neck: Supple, symmetrical  Lungs:   clear to auscultation bilaterally, Respirations unlabored, no coughing  Heart:  regular rate and rhythm and no murmur, Appears well perfused  Extremities: No edema  Skin: Skin color, texture, turgor  normal and no rashes or lesions on visualized portions of skin  Neurologic: No gross deficits   Labs:  Lab Orders  No laboratory test(s) ordered today    Spirometry:  Tracings reviewed. His effort: Good reproducible efforts. FVC:  4.84L FEV1: 4.03L, 83% predicted FEV1/FVC ratio: 0.83 Interpretation: Spirometry consistent with normal pattern.  Please see scanned spirometry results for details.  Assessment/Plan   Seasonal and perennial allergic rhinitis: at goal - allergen avoidance towards grasses, trees, dust mites and cat - Continue Zyrtec  (Cetirizine ) 10mg  daily. Your options include: Zyrtec  (cetirizine ) 10 mg, Claritin (loratadine) 10 mg, Xyzal (levocetirizine) 5 mg or Allegra (fexofenadine) 180 mg daily as needed - Consider nasal saline rinses to help remove pollens, mucus and hydrate nasal mucosa - Continue Astelin  (azelastine ) use 1-2 sprays in each nostril up to two times daily as needed for NASAL CONGESTION/ITCHY NOSE - continue Flonase  (fluticasone ) 1-2 spray in each nostril daily.  Allergic Conjunctivitis: at goal - continue pataday  1 drop in each eye twice daily as needed   Intermittent Asthma: at goal - Controller Inhaler: Advair  115, 2 puffs twice daily. Use this every day.  Rinse mouth after use. - During flares: increase Advair  115 to 3-4 puffs twice daily and continue for 2 weeks or until symptoms resolve. - Rescue meds: albuterol  2 puffs every 4-6 hours as needed for coughing, shortness of breath or wheezing. - Use a spacer with all inhalers - please keep track of how often you are needing rescue inhaler Albuterol  (Proair /Ventolin ) as this will help guide future management - Asthma is not controlled if:  - Symptoms are occurring >2 times a week OR  - >2 times a month nighttime awakenings  - Please call the clinic to schedule a follow up if these symptoms arise  Follow-up in 6 months, sooner if needed. It was a pleasure seeing you again in clinic today.   Other: none  Jonathon Neighbors, MD  [facility] 

## 2024-04-17 NOTE — Addendum Note (Signed)
 Addended by: Vicenta Graft on: 04/17/2024 01:48 PM   Modules accepted: Orders

## 2024-10-17 ENCOUNTER — Ambulatory Visit: Admitting: Internal Medicine

## 2024-10-24 ENCOUNTER — Ambulatory Visit: Admitting: Internal Medicine

## 2024-12-24 ENCOUNTER — Encounter (HOSPITAL_BASED_OUTPATIENT_CLINIC_OR_DEPARTMENT_OTHER): Payer: Self-pay | Admitting: Emergency Medicine

## 2024-12-24 ENCOUNTER — Other Ambulatory Visit: Payer: Self-pay

## 2024-12-24 ENCOUNTER — Emergency Department (HOSPITAL_BASED_OUTPATIENT_CLINIC_OR_DEPARTMENT_OTHER)
Admission: EM | Admit: 2024-12-24 | Discharge: 2024-12-24 | Disposition: A | Discharge status: Home / Self Care | Attending: Emergency Medicine | Admitting: Emergency Medicine

## 2024-12-24 DIAGNOSIS — J45909 Unspecified asthma, uncomplicated: Secondary | ICD-10-CM | POA: Insufficient documentation

## 2024-12-24 DIAGNOSIS — M545 Low back pain, unspecified: Secondary | ICD-10-CM | POA: Diagnosis present

## 2024-12-24 DIAGNOSIS — I10 Essential (primary) hypertension: Secondary | ICD-10-CM | POA: Diagnosis not present

## 2024-12-24 MED ORDER — PREDNISONE 10 MG PO TABS: 40.0000 mg | ORAL_TABLET | Freq: Every day | ORAL | 0 refills | Status: AC

## 2024-12-24 MED ORDER — KETOROLAC TROMETHAMINE 15 MG/ML IJ SOLN
15.0000 mg | Freq: Once | INTRAMUSCULAR | Status: AC
  Administered 2024-12-24: 15 mg via INTRAMUSCULAR
  Filled 2024-12-24: qty 1

## 2024-12-24 MED ORDER — METHOCARBAMOL 500 MG PO TABS: 500.0000 mg | ORAL_TABLET | Freq: Two times a day (BID) | ORAL | 0 refills | Status: AC

## 2024-12-24 MED ORDER — METHOCARBAMOL 500 MG PO TABS: 500.0000 mg | ORAL_TABLET | Freq: Two times a day (BID) | ORAL | 0 refills | Status: DC

## 2024-12-24 MED ORDER — PREDNISONE 10 MG PO TABS: 40.0000 mg | ORAL_TABLET | Freq: Every day | ORAL | 0 refills | Status: DC

## 2024-12-24 NOTE — ED Provider Notes (Signed)
 " [facility] Provider Note   CSN: [id number] Arrival date & time: 12/24/24  1644     Patient presents with: Back Pain   Martin Miles is a 39 y.o. male veteran who presents with acute on chronic back pain.  Denies any new injury or trauma.  Pain started this morning.  Without any radicular symptoms.  No urinary or fecal incontinence.  Is still ambulatory.  Pain is worse with movement.  No urinary symptoms.  No recent fevers    Back Pain     Past Medical History:  Diagnosis Date   Anxiety    Asthma    Hypertension    PTSD (post-traumatic stress disorder)    trigger  - large crowds   Past Surgical History:  Procedure Laterality Date   APPENDECTOMY     HERNIA REPAIR     NASAL SEPTUM SURGERY       Prior to Admission medications  Medication Sig Start Date End Date Taking? Authorizing Provider  methocarbamol  (ROBAXIN ) 500 MG tablet Take 1 tablet (500 mg total) by mouth 2 (two) times daily. 12/24/24  Yes Donnajean Lynwood DEL, PA-C  predniSONE  (DELTASONE ) 10 MG tablet Take 4 tablets (40 mg total) by mouth daily. 12/24/24  Yes Donnajean Lynwood DEL, PA-C  albuterol  (VENTOLIN  HFA) 108 (90 Base) MCG/ACT inhaler Inhale 2 puffs into the lungs every 6 (six) hours as needed for wheezing or shortness of breath. 04/17/24   Marinda Rocky SAILOR, MD  azelastine  (ASTELIN ) 0.1 % nasal spray Place 2 sprays into both nostrils 2 (two) times daily as needed for rhinitis or allergies. Use in each nostril as directed 04/17/24   Marinda Rocky SAILOR, MD  busPIRone (BUSPAR) 15 MG tablet Take 15 mg by mouth 3 (three) times daily.    [provider]  cetirizine  (ZYRTEC ) 10 MG tablet Take 1 tablet (10 mg total) by mouth daily. 04/17/24   Marinda Rocky SAILOR, MD  fluticasone  (FLONASE ) 50 MCG/ACT nasal spray Place 2 sprays into both nostrils daily. 04/17/24   Marinda Rocky SAILOR, MD  fluticasone -salmeterol (ADVAIR  HFA) 115-21 MCG/ACT inhaler Inhale 2 puffs into the lungs 2 (two) times  daily. 04/17/24   Marinda Rocky SAILOR, MD  olopatadine  (PATANOL) 0.1 % ophthalmic solution Place 1 drop into both eyes 2 (two) times daily as needed for allergies. 04/17/24   Marinda Rocky SAILOR, MD  ondansetron  (ZOFRAN  ODT) 4 MG disintegrating tablet Take 1 tablet (4 mg total) by mouth every 8 (eight) hours as needed for nausea or vomiting. 05/19/21   Haviland, Julie, MD  sertraline (ZOLOFT) 25 MG tablet Take 50 mg by mouth daily.    [provider]  SUMAtriptan (IMITREX) 50 MG tablet Take 50 mg by mouth once. 04/10/23   [provider]    Allergies: Remeron [mirtazapine]    Review of Systems  Musculoskeletal:  Positive for back pain.    Updated Vital Signs BP 125/80 (BP Location: Right Arm)   Pulse 79   Temp 98 F (36.7 C) (Oral)   Resp 17   Ht 6' 2 (1.88 m)   Wt 124.7 kg   SpO2 95%   BMI 35.31 kg/m   Physical Exam Vitals and nursing note reviewed.  Constitutional:      General: He is not in acute distress.    Appearance: He is well-developed.  HENT:     Head: Normocephalic and atraumatic.  Eyes:     Conjunctiva/sclera: Conjunctivae normal.  Cardiovascular:  Rate and Rhythm: Normal rate and regular rhythm.     Heart sounds: No murmur heard. Pulmonary:     Effort: Pulmonary effort is normal. No respiratory distress.     Breath sounds: Normal breath sounds.  Abdominal:     Palpations: Abdomen is soft.     Tenderness: There is no abdominal tenderness.  Musculoskeletal:        General: No swelling.     Cervical back: Neck supple.     Comments: No focal back tenderness, 5 out of 5 lower extremity strength, sensation intact, ambulates without difficulty  Skin:    General: Skin is warm and dry.     Capillary Refill: Capillary refill takes less than 2 seconds.  Neurological:     Mental Status: He is alert.  Psychiatric:        Mood and Affect: Mood normal.     (all labs ordered are listed, but only abnormal results are displayed) Labs Reviewed - No data to  display  EKG: None  Radiology: No results found.   Procedures   Medications Ordered in the ED  ketorolac  (TORADOL ) 15 MG/ML injection 15 mg (15 mg Intramuscular Given 12/24/24 1900)    Clinical Course as of 12/24/24 1902  Tue Dec 24, 2024  1849 Patient evaluated for acute on chronic back pain that started this morning without any new injury or trauma.  No red flag symptoms.  Exam is benign.  Is ambulatory.  Will provide a shot of Toradol  here in the ED and discharge home course of Robaxin  and prednisone .  Do not feel that any urgent neurosurgery consult or imaging is indicated here in the ED.  Will provide [JT]    Clinical Course User Index [JT] Donnajean Lynwood DEL, PA-C                                 Medical Decision Making  This patient presents to the ED with chief complaint(s) of Back pain.  The complaint involves an extensive differential diagnosis and also carries with it a high risk of complications and morbidity.   Pertinent past medical history as listed in HPI  The differential diagnosis includes  Patient of exam and history do not suspect fracture, cauda equina, discitis, spinal abscess Additional history obtained: Records reviewed Care Everywhere/External Records  Disposition:   Patient will be discharged home. The patient has been appropriately medically screened and/or stabilized in the ED. I have low suspicion for any other emergent medical condition which would require further screening, evaluation or treatment in the ED or require inpatient management. At time of discharge the patient is hemodynamically stable and in no acute distress. I have discussed work-up results and diagnosis with patient and answered all questions. Patient is agreeable with discharge plan. We discussed strict return precautions for returning to the emergency department and they verbalized understanding.     Social Determinants of Health:   none  This note was dictated with voice  recognition software.  Despite best efforts at proofreading, errors may have occurred which can change the documentation meaning.       Final diagnoses:  Acute midline low back pain without sciatica    ED Discharge Orders          Ordered    methocarbamol  (ROBAXIN ) 500 MG tablet  2 times daily        12/24/24 1856    predniSONE  (DELTASONE ) 10 MG tablet  Daily        12/24/24 1856               Donnajean Lynwood VEAR DEVONNA 12/24/24 RETHA Patsey Lot, MD 12/24/24 2306  "

## 2024-12-24 NOTE — ED Notes (Signed)
..  The patient is A&OX4, ambulatory at d/c with independent steady gait, NAD. Pt verbalized understanding of d/c instructions, prescriptions and follow up care.

## 2024-12-24 NOTE — Discharge Instructions (Addendum)
 You were evaluated in the emergency room for back pain.  A prescription for Robaxin , muscle relaxer as well as prednisone  was sent to your pharmacy.  Please avoid driving and drinking alcohol while using this medication as it can cause drowsiness.  You are provided a referral for orthopedics.  Please call make an appointment at your earliest convenience.

## 2024-12-24 NOTE — ED Triage Notes (Signed)
 Pt c/o mid and lower back pain since this AM.  Reports hx of back issues.   Denies urinary sx, known injury.
